# Patient Record
Sex: Male | Born: 1955 | Race: White | Hispanic: No | Marital: Single | Smoking: Former smoker
Health system: Southern US, Community
[De-identification: ages and names within clinical notes are randomized; demographics above are authoritative.]

## PROBLEM LIST (undated history)

## (undated) VITALS — BP 108/63 | HR 109 | Temp 98.0°F

## (undated) DIAGNOSIS — C801 Malignant (primary) neoplasm, unspecified: Secondary | ICD-10-CM

## (undated) DIAGNOSIS — F32A Depression, unspecified: Secondary | ICD-10-CM

## (undated) DIAGNOSIS — C029 Malignant neoplasm of tongue, unspecified: Secondary | ICD-10-CM

## (undated) DIAGNOSIS — F419 Anxiety disorder, unspecified: Secondary | ICD-10-CM

## (undated) DIAGNOSIS — S069X9A Unspecified intracranial injury with loss of consciousness of unspecified duration, initial encounter: Secondary | ICD-10-CM

## (undated) DIAGNOSIS — C189 Malignant neoplasm of colon, unspecified: Secondary | ICD-10-CM

## (undated) DIAGNOSIS — K219 Gastro-esophageal reflux disease without esophagitis: Secondary | ICD-10-CM

## (undated) DIAGNOSIS — S069XAA Unspecified intracranial injury with loss of consciousness status unknown, initial encounter: Secondary | ICD-10-CM

## (undated) DIAGNOSIS — F329 Major depressive disorder, single episode, unspecified: Secondary | ICD-10-CM

## (undated) DIAGNOSIS — I10 Essential (primary) hypertension: Secondary | ICD-10-CM

## (undated) DIAGNOSIS — M1990 Unspecified osteoarthritis, unspecified site: Secondary | ICD-10-CM

## (undated) DIAGNOSIS — E785 Hyperlipidemia, unspecified: Secondary | ICD-10-CM

## (undated) HISTORY — DX: Major depressive disorder, single episode, unspecified: F32.9

## (undated) HISTORY — PX: SPLENECTOMY: SUR1306

## (undated) HISTORY — DX: Anxiety disorder, unspecified: F41.9

## (undated) HISTORY — DX: Unspecified intracranial injury with loss of consciousness of unspecified duration, initial encounter: S06.9X9A

## (undated) HISTORY — DX: Hyperlipidemia, unspecified: E78.5

## (undated) HISTORY — DX: Malignant neoplasm of tongue, unspecified: C02.9

## (undated) HISTORY — DX: Essential (primary) hypertension: I10

## (undated) HISTORY — DX: Malignant neoplasm of colon, unspecified: C18.9

## (undated) HISTORY — DX: Unspecified intracranial injury with loss of consciousness status unknown, initial encounter: S06.9XAA

## (undated) HISTORY — DX: Depression, unspecified: F32.A

## (undated) HISTORY — DX: Unspecified osteoarthritis, unspecified site: M19.90

## (undated) HISTORY — DX: Hypomagnesemia: E83.42

## (undated) HISTORY — PX: COLECTOMY: SHX59

## (undated) HISTORY — DX: Gastro-esophageal reflux disease without esophagitis: K21.9

## (undated) HISTORY — DX: Malignant (primary) neoplasm, unspecified: C80.1

---

## 2010-02-11 ENCOUNTER — Emergency Department (HOSPITAL_COMMUNITY): Admission: EM | Admit: 2010-02-11 | Discharge: 2010-02-11 | Payer: Self-pay | Admitting: Emergency Medicine

## 2010-08-30 LAB — URINALYSIS, ROUTINE W REFLEX MICROSCOPIC
Bilirubin Urine: NEGATIVE
Glucose, UA: NEGATIVE mg/dL
Hgb urine dipstick: NEGATIVE
Ketones, ur: NEGATIVE mg/dL
Nitrite: NEGATIVE
Protein, ur: NEGATIVE mg/dL
Specific Gravity, Urine: 1.017 (ref 1.005–1.030)
Urobilinogen, UA: 1 mg/dL (ref 0.0–1.0)
pH: 6.5 (ref 5.0–8.0)

## 2010-11-30 ENCOUNTER — Emergency Department (HOSPITAL_COMMUNITY): Payer: Self-pay

## 2010-11-30 ENCOUNTER — Emergency Department (HOSPITAL_COMMUNITY)
Admission: EM | Admit: 2010-11-30 | Discharge: 2010-11-30 | Disposition: A | Discharge status: Home / Self Care | Payer: No Typology Code available for payment source | Attending: Emergency Medicine | Admitting: Emergency Medicine

## 2010-11-30 ENCOUNTER — Emergency Department (HOSPITAL_COMMUNITY): Payer: No Typology Code available for payment source

## 2010-11-30 DIAGNOSIS — M546 Pain in thoracic spine: Secondary | ICD-10-CM | POA: Insufficient documentation

## 2010-11-30 DIAGNOSIS — I10 Essential (primary) hypertension: Secondary | ICD-10-CM | POA: Insufficient documentation

## 2010-11-30 DIAGNOSIS — S139XXA Sprain of joints and ligaments of unspecified parts of neck, initial encounter: Secondary | ICD-10-CM | POA: Insufficient documentation

## 2010-11-30 DIAGNOSIS — S4350XA Sprain of unspecified acromioclavicular joint, initial encounter: Secondary | ICD-10-CM | POA: Insufficient documentation

## 2010-11-30 DIAGNOSIS — G8929 Other chronic pain: Secondary | ICD-10-CM | POA: Insufficient documentation

## 2010-11-30 DIAGNOSIS — M25519 Pain in unspecified shoulder: Secondary | ICD-10-CM | POA: Insufficient documentation

## 2010-11-30 DIAGNOSIS — E041 Nontoxic single thyroid nodule: Secondary | ICD-10-CM | POA: Insufficient documentation

## 2010-11-30 DIAGNOSIS — M542 Cervicalgia: Secondary | ICD-10-CM | POA: Insufficient documentation

## 2013-12-07 LAB — HM COLONOSCOPY

## 2014-04-10 ENCOUNTER — Emergency Department: Payer: Self-pay | Admitting: Emergency Medicine

## 2014-09-18 ENCOUNTER — Ambulatory Visit
Admit: 2014-09-18 | Disposition: A | Discharge status: Home / Self Care | Payer: Self-pay | Attending: Oncology | Admitting: Oncology

## 2014-09-29 ENCOUNTER — Ambulatory Visit
Admit: 2014-09-29 | Disposition: A | Discharge status: Home / Self Care | Payer: Self-pay | Attending: Vascular Surgery | Admitting: Vascular Surgery

## 2014-10-02 DIAGNOSIS — R079 Chest pain, unspecified: Secondary | ICD-10-CM

## 2014-10-02 LAB — CBC CANCER CENTER
Basophil #: 0.1 x10 3/mm (ref 0.0–0.1)
Basophil %: 1 %
EOS ABS: 0.1 x10 3/mm (ref 0.0–0.7)
Eosinophil %: 1.2 %
HCT: 41.5 % (ref 40.0–52.0)
HGB: 13.9 g/dL (ref 13.0–18.0)
LYMPHS ABS: 2.8 x10 3/mm (ref 1.0–3.6)
LYMPHS PCT: 26.3 %
MCH: 31.4 pg (ref 26.0–34.0)
MCHC: 33.5 g/dL (ref 32.0–36.0)
MCV: 94 fL (ref 80–100)
MONO ABS: 1.3 x10 3/mm — AB (ref 0.2–1.0)
Monocyte %: 12.4 %
NEUTROS ABS: 6.3 x10 3/mm (ref 1.4–6.5)
Neutrophil %: 59.1 %
PLATELETS: 289 x10 3/mm (ref 150–440)
RBC: 4.43 10*6/uL (ref 4.40–5.90)
RDW: 14.3 % (ref 11.5–14.5)
WBC: 10.7 x10 3/mm — ABNORMAL HIGH (ref 3.8–10.6)

## 2014-10-02 LAB — COMPREHENSIVE METABOLIC PANEL
ALK PHOS: 70 U/L
ALT: 18 U/L
AST: 21 U/L
Albumin: 4.3 g/dL
Anion Gap: 7 (ref 7–16)
BILIRUBIN TOTAL: 0.6 mg/dL
BUN: 21 mg/dL — ABNORMAL HIGH
CALCIUM: 9.4 mg/dL
CO2: 27 mmol/L
Chloride: 102 mmol/L
Creatinine: 0.89 mg/dL
EGFR (African American): >60
Glucose: 108 mg/dL — ABNORMAL HIGH
POTASSIUM: 3.7 mmol/L
SODIUM: 136 mmol/L
Total Protein: 7.5 g/dL

## 2014-10-02 LAB — CREATININE, SERUM: CREATINE, SERUM: 0.89

## 2014-10-09 LAB — CBC CANCER CENTER
BASOS ABS: 0 x10 3/mm (ref 0.0–0.1)
Basophil %: 0.8 %
Eosinophil #: 0.1 x10 3/mm (ref 0.0–0.7)
Eosinophil %: 2.8 %
HCT: 46.4 % (ref 40.0–52.0)
HGB: 15.4 g/dL (ref 13.0–18.0)
LYMPHS PCT: 36.8 %
Lymphocyte #: 1.3 x10 3/mm (ref 1.0–3.6)
MCH: 31.2 pg (ref 26.0–34.0)
MCHC: 33.3 g/dL (ref 32.0–36.0)
MCV: 94 fL (ref 80–100)
MONOS PCT: 3.9 %
Monocyte #: 0.1 x10 3/mm — ABNORMAL LOW (ref 0.2–1.0)
NEUTROS PCT: 55.7 %
Neutrophil #: 2 x10 3/mm (ref 1.4–6.5)
PLATELETS: 194 x10 3/mm (ref 150–440)
RBC: 4.95 10*6/uL (ref 4.40–5.90)
RDW: 13.9 % (ref 11.5–14.5)
WBC: 3.7 x10 3/mm — AB (ref 3.8–10.6)

## 2014-10-09 LAB — COMPREHENSIVE METABOLIC PANEL
ALK PHOS: 73 U/L
ANION GAP: 7 (ref 7–16)
Albumin: 4.4 g/dL
BILIRUBIN TOTAL: 0.8 mg/dL
BUN: 44 mg/dL — AB
CO2: 27 mmol/L
Calcium, Total: 9.3 mg/dL
Chloride: 101 mmol/L
Creatinine: 1.3 mg/dL — ABNORMAL HIGH
EGFR (African American): >60
EGFR (Non-African Amer.): >60
Glucose: 109 mg/dL — ABNORMAL HIGH
Potassium: 4.7 mmol/L
SGOT(AST): 22 U/L
SGPT (ALT): 22 U/L
Sodium: 135 mmol/L
TOTAL PROTEIN: 7.7 g/dL

## 2014-10-11 ENCOUNTER — Inpatient Hospital Stay
Admit: 2014-10-11 | Discharge: 2014-10-14 | Disposition: A | Discharge status: Home / Self Care | Payer: No Typology Code available for payment source | Source: Ambulatory Visit | Attending: Internal Medicine | Admitting: Internal Medicine

## 2014-10-11 LAB — DIFFERENTIAL
BASOS ABS: 0 10*3/uL (ref 0.0–0.1)
BASOS PCT: 0.5 %
EOS PCT: 1.2 %
Eosinophil #: 0 10*3/uL (ref 0.0–0.7)
Lymphocyte #: 0.9 10*3/uL — ABNORMAL LOW (ref 1.0–3.6)
Lymphocyte %: 62.1 %
MONOS PCT: 26.6 %
Monocyte #: 0.4 x10 3/mm (ref 0.2–1.0)
NEUTROS ABS: 0.1 10*3/uL — AB (ref 1.4–6.5)
Neutrophil %: 9.6 %

## 2014-10-11 LAB — CBC
HCT: 42.4 % (ref 40.0–52.0)
HGB: 13.8 g/dL (ref 13.0–18.0)
MCH: 30.9 pg (ref 26.0–34.0)
MCHC: 32.6 g/dL (ref 32.0–36.0)
MCV: 95 fL (ref 80–100)
Platelet: 166 10*3/uL (ref 150–440)
RBC: 4.47 10*6/uL (ref 4.40–5.90)
RDW: 13.9 % (ref 11.5–14.5)
WBC: 1.5 10*3/uL — AB (ref 3.8–10.6)

## 2014-10-11 LAB — COMPREHENSIVE METABOLIC PANEL
ALK PHOS: 56 U/L
Albumin: 3.9 g/dL
Anion Gap: 10 (ref 7–16)
BILIRUBIN TOTAL: 0.6 mg/dL
BUN: 55 mg/dL — ABNORMAL HIGH
CREATININE: 1.74 mg/dL — AB
Calcium, Total: 9.2 mg/dL
Chloride: 101 mmol/L
Co2: 26 mmol/L
EGFR (Non-African Amer.): 43 — ABNORMAL LOW
GFR CALC AF AMER: 49 — AB
Glucose: 117 mg/dL — ABNORMAL HIGH
POTASSIUM: 4.4 mmol/L
SGOT(AST): 29 U/L
SGPT (ALT): 23 U/L
Sodium: 137 mmol/L
Total Protein: 7 g/dL

## 2014-10-11 LAB — URINALYSIS, COMPLETE
Bilirubin,UR: NEGATIVE
Blood: NEGATIVE
Glucose,UR: NEGATIVE mg/dL (ref 0–75)
Ketone: NEGATIVE
Leukocyte Esterase: NEGATIVE
Nitrite: NEGATIVE
Ph: 5 (ref 4.5–8.0)
Protein: NEGATIVE
Specific Gravity: 1.024 (ref 1.003–1.030)

## 2014-10-12 LAB — BASIC METABOLIC PANEL
Anion Gap: 7 (ref 7–16)
BUN: 34 mg/dL — ABNORMAL HIGH
CALCIUM: 7.5 mg/dL — AB
CHLORIDE: 107 mmol/L
CO2: 22 mmol/L
Creatinine: 1.22 mg/dL
EGFR (African American): >60
EGFR (Non-African Amer.): >60
Glucose: 104 mg/dL — ABNORMAL HIGH
POTASSIUM: 3.8 mmol/L
Sodium: 136 mmol/L

## 2014-10-12 LAB — CBC WITH DIFFERENTIAL/PLATELET
Bands: 1 %
Basophil: 1 %
Comment - H1-Com2: NORMAL
Eosinophil: 2 %
HCT: 36 % — ABNORMAL LOW (ref 40.0–52.0)
HGB: 12 g/dL — ABNORMAL LOW (ref 13.0–18.0)
Lymphocytes: 57 %
MCH: 31.2 pg (ref 26.0–34.0)
MCHC: 33.3 g/dL (ref 32.0–36.0)
MCV: 94 fL (ref 80–100)
Monocytes: 38 %
PLATELETS: 131 10*3/uL — AB (ref 150–440)
RBC: 3.85 10*6/uL — ABNORMAL LOW (ref 4.40–5.90)
RDW: 13.7 % (ref 11.5–14.5)
Segmented Neutrophils: 1 %
WBC: 1.6 10*3/uL — AB (ref 3.8–10.6)

## 2014-10-13 LAB — CBC WITH DIFFERENTIAL/PLATELET
BASOS ABS: 1 %
Bands: 12 %
Eosinophil: 1 %
HCT: 37.3 % — ABNORMAL LOW (ref 40.0–52.0)
HGB: 12.5 g/dL — ABNORMAL LOW (ref 13.0–18.0)
LYMPHS PCT: 47 %
MCH: 31.3 pg (ref 26.0–34.0)
MCHC: 33.5 g/dL (ref 32.0–36.0)
MCV: 94 fL (ref 80–100)
Metamyelocyte: 1 %
Monocytes: 25 %
NRBC/100 WBC: 1 /
Platelet: 159 10*3/uL (ref 150–440)
RBC: 3.99 10*6/uL — ABNORMAL LOW (ref 4.40–5.90)
RDW: 13.6 % (ref 11.5–14.5)
Segmented Neutrophils: 13 %
WBC: 4 10*3/uL (ref 3.8–10.6)

## 2014-10-13 LAB — BASIC METABOLIC PANEL
ANION GAP: 6 — AB (ref 7–16)
BUN: 27 mg/dL — ABNORMAL HIGH
CALCIUM: 7.6 mg/dL — AB
CHLORIDE: 105 mmol/L
Co2: 23 mmol/L
Creatinine: 1.03 mg/dL
EGFR (African American): >60
GLUCOSE: 111 mg/dL — AB
Potassium: 3.2 mmol/L — ABNORMAL LOW
SODIUM: 134 mmol/L — AB

## 2014-10-13 LAB — CLOSTRIDIUM DIFFICILE(ARMC)

## 2014-10-13 LAB — CULTURE, BLOOD (SINGLE)

## 2014-10-13 LAB — URINE CULTURE

## 2014-10-14 LAB — CBC WITH DIFFERENTIAL/PLATELET
Bands: 13 %
HCT: 36.7 % — AB (ref 40.0–52.0)
HGB: 12.2 g/dL — ABNORMAL LOW (ref 13.0–18.0)
LYMPHS PCT: 24 %
MCH: 31.2 pg (ref 26.0–34.0)
MCHC: 33.2 g/dL (ref 32.0–36.0)
MCV: 94 fL (ref 80–100)
Monocytes: 21 %
NRBC/100 WBC: 1 /
PLATELETS: 206 10*3/uL (ref 150–440)
RBC: 3.9 10*6/uL — ABNORMAL LOW (ref 4.40–5.90)
RDW: 14 % (ref 11.5–14.5)
Segmented Neutrophils: 42 %
WBC: 10.7 10*3/uL — AB (ref 3.8–10.6)

## 2014-10-14 LAB — VANCOMYCIN, TROUGH: VANCOMYCIN, TROUGH: 10 ug/mL

## 2014-10-14 MED ORDER — PANTOPRAZOLE SODIUM 40 MG PO TBEC: 40.0000 mg | DELAYED_RELEASE_TABLET | Freq: Every day | ORAL | Status: DC

## 2014-10-14 MED ORDER — LISINOPRIL 20 MG PO TABS: 20.0000 mg | ORAL_TABLET | Freq: Every day | ORAL | Status: DC

## 2014-10-14 MED ORDER — SIMVASTATIN 20 MG PO TABS: 20.0000 mg | ORAL_TABLET | Freq: Every day | ORAL | Status: DC

## 2014-10-14 MED ORDER — VITAMIN C 500 MG PO TABS: 500.0000 mg | ORAL_TABLET | Freq: Two times a day (BID) | ORAL | Status: DC

## 2014-10-14 MED ORDER — LEVOFLOXACIN 750 MG PO TABS: 750.0000 mg | ORAL_TABLET | ORAL | Status: DC

## 2014-10-14 MED ORDER — VANCOMYCIN HCL 10 G IV SOLR: 1250.0000 mg | Freq: Two times a day (BID) | INTRAVENOUS | Status: DC

## 2014-10-14 MED ORDER — HYDROCODONE-ACETAMINOPHEN 5-325 MG PO TABS: 1.0000 | ORAL_TABLET | ORAL | Status: DC | PRN

## 2014-10-14 MED ORDER — DEXTROSE 5 % IV SOLN: 2.0000 g | Freq: Three times a day (TID) | INTRAVENOUS | Status: DC

## 2014-10-14 MED ORDER — ONDANSETRON HCL 4 MG/2ML IJ SOLN: 4.0000 mg | INTRAMUSCULAR | Status: DC | PRN

## 2014-10-14 MED ORDER — MIRTAZAPINE 15 MG PO TABS: 15.0000 mg | ORAL_TABLET | Freq: Every day | ORAL | Status: DC

## 2014-10-14 MED ORDER — ACETAMINOPHEN 325 MG PO TABS: 650.0000 mg | ORAL_TABLET | ORAL | Status: DC | PRN

## 2014-10-14 MED ORDER — OXYCODONE HCL 5 MG PO TABS: 10.0000 mg | ORAL_TABLET | Freq: Three times a day (TID) | ORAL | Status: DC | PRN

## 2014-10-14 MED ORDER — SODIUM CHLORIDE 0.9 % IJ SOLN: 3.0000 mL | INTRAMUSCULAR | Status: DC | PRN

## 2014-10-14 MED ORDER — CALCIUM CARBONATE ANTACID 500 MG PO CHEW: 400.0000 mg | CHEWABLE_TABLET | Freq: Four times a day (QID) | ORAL | Status: DC | PRN

## 2014-10-14 MED ORDER — MAGIC MOUTHWASH
10.0000 mL | Freq: Three times a day (TID) | ORAL | Status: DC
  Filled 2014-10-14 (×4): qty 10

## 2014-10-14 MED ORDER — NYSTATIN 100000 UNIT/ML MT SUSP: 5.0000 mL | Freq: Four times a day (QID) | OROMUCOSAL | Status: DC

## 2014-10-14 MED ORDER — FILGRASTIM 300 MCG/ML IJ SOLN: 300.0000 ug | INTRAMUSCULAR | Status: DC

## 2014-10-14 MED ORDER — SODIUM CHLORIDE 0.9 % IV SOLN: INTRAVENOUS | Status: DC

## 2014-10-15 NOTE — Op Note (Signed)
PATIENT NAME:  Mathew Robinson, Mathew Robinson MR#:  [id number] DATE OF BIRTH:  1956/01/07  DATE OF PROCEDURE:  09/29/2014  PREOPERATIVE DIAGNOSES:  Cancer of the tongue.  POSTOPERATIVE DIAGNOSIS:  Cancer of the tongue.  PROCEDURE PERFORMED: Insertion of a right internal jugular Infusaport with ultrasound and fluoroscopic guidance.   SURGEON: Katha Cabal, M.D.   SEDATION:  Versed plus fentanyl.   ACCESS: Right internal jugular.  FLUOROSCOPY TIME: Less than 0.1 minutes.   CONTRAST USED: None.   INDICATIONS: [name] is a 59 year old gentleman who has been found to have cancer of the tongue. He is, therefore, requiring chemotherapy and needs appropriate venous access. Risks and benefits for port insertion are reviewed. All questions answered. The patient agrees to proceed.   DESCRIPTION OF PROCEDURE: The patient is taken to special procedures and placed in the supine position. After adequate sedation is achieved, his neck and chest wall are prepped and draped in a sterile fashion. Appropriate timeout is called.   One percent lidocaine is infiltrated in the soft tissues at the base of the neck as well as on the chest wall. Ultrasound is placed in a sterile sleeve. Jugular vein is identified. It is echolucent and compressible indicating patency. Image is recorded for the permanent record and under real-time visualization, a microneedle is inserted into the jugular vein. Microwire is advanced and verified with fluoroscopic guidance. MicroSheath is then inserted and J-wire is advanced and positioned so that the tip is in the inferior vena cava.   A small counterincision is made at the wire insertion site and a linear incision is made 2 fingerbreadths below the clavicle. Pocket is fashioned with both blunt and sharp dissection.  It is then verified for appropriate size with the hub. The catheter is then passed subcutaneously from the pocket to the neck counterincision. Dilator and peel-away sheath are  inserted over the wire and the wire and dilator are removed. The catheter is inserted into the central venous system and the peel-away sheath is removed.   Under fluoroscopic guidance, the catheter is positioned with its tip at the cavoatrial junction.  It is then transected. The hub is connected and slipped into the pocket. The Port-A-Cath is then accessed percutaneously. It aspirates easily and flushes well. It is then verified under fluoroscopy to be smooth in contour with the tip in the proper position.   The pocket is then closed in layers using 3-0 Vicryl followed by 4-0 Monocryl subcuticular and Dermabond. Neck counterincision is closed with 4-0 Monocryl subcuticular. The patient tolerated the procedure well and there were no immediate complications. He was taken to recovery in excellent condition.   ____________________________ Katha Cabal, MD ggs:sp D: 10/02/2014 11:56:19 ET T: 10/02/2014 16:35:59 ET JOB#: [id number]  cc: Katha Cabal, MD, <Dictator> Kathlene November. Grayland Ormond, MD Katha Cabal MD ELECTRONICALLY SIGNED 10/03/2014 12:55

## 2014-10-15 NOTE — Consult Note (Addendum)
PATIENT NAME:  Mathew Robinson, Mathew Robinson MR#:  [id number] DATE OF BIRTH:  Dec 31, 1955  DATE OF CONSULTATION:  10/12/2014  REFERRING PHYSICIAN:  Dr. Manuella Ghazi CONSULTING PHYSICIAN:  Cheral Marker. Ola Spurr, MD  REASON FOR CONSULTATION:  Neutropenic fever and head and neck cancer.   HISTORY OF PRESENT ILLNESS:  This is a pleasant 59 year old gentleman with recent diagnosis of tongue cancer who has started chemotherapy.  He had a port placed 09/29/2014.  He was admitted 10/11/2014 with 1 to 2 days of high fevers to 103 as well as some chills.  He has had a little bit of nausea and vomiting as well.  He has not had any cough, shortness of breath. He has some mild sore throat.  He has mild diffuse abdominal pain.  He has not had any joint swelling or redness.  He did have a port placed and he feels it is not tender or giving him any problems at this point.  He has been started on vancomycin and cefepime.  Cultures so far are negative.   PAST MEDICAL HISTORY:   1.  Head and neck cancer as above starting chemotherapy.  2.  Hyperlipidemia.   3.  Hypertension.   4.  Arthritis.   PAST SURGICAL HISTORY:  Colectomy and splenectomy.    SOCIAL HISTORY:  50-pack-years, quit 20 years ago.  Prior heavy alcohol use.  He lives alone. Siblings are involved in his care.   FAMILY HISTORY:  Positive for skin cancer.  Father had head and neck cancer.    ANTIBIOTICS SINCE ADMISSION:  Vancomycin and Zosyn.   REVIEW OF SYSTEMS:  11 systems were reviewed and negative except as per HPI.     ALLERGIES:  PENICILLIN AND TRAMADOL.  PENICILLIN CAUSES HIVES BUT NOT ANAPHYLAXIS.   PHYSICAL EXAMINATION:  VITAL SIGNS:  Temperature 98.6, T-max 103.2, pulse 96, respirations 20, blood pressure 104/64, pulse oximetry 96% on room air.  GENERAL:  He is pleasant, interactive, in no acute distress.  His face is quite red and sunburned but he says he has been out at the sun.  HEENT:  Pupils are reactive.  Sclerae are anicteric.  His oropharynx has some  mild thrush.  He is edentulous.  NECK:  Supple.  No anterior cervical, posterior cervical or supraclavicular lymphadenopathy.  HEART:  Tachy but regular.  LUNGS:  Clear bilaterally.  ABDOMEN:  Soft, nondistended, mild nonfocal tenderness to palpation.  No right lower quadrant pain, however.  EXTREMITIES:  No clubbing, cyanosis or edema.  NEUROLOGIC:  He is alert and oriented, somewhat poor historian.  Nonfocal neuro exam.  SKIN:  No rashes or lesions.  VASCULAR ACCESS:  He has a Port-A-Cath in his right chest.  Since it is relatively placed, the incision is well healing, but there is a scab over it.  There is no tenderness.  He is diffusely red over the area but more in a sunburn pattern.   LABORATORY DATA:  White count on 10/11/2014 was 1.5 with a neutrophil count of 0.1. Followup white count 10/12/2014, 1.6, hemoglobin 12.0, platelets 133,000.  His white blood count on 10/09/2014 was 5.7 with a neutrophil count of 2.0.  Blood cultures x 2 are negative. Urinalysis negative.  Urine culture negative.  Renal function shows a creatinine of 1.22, down from 1.74 on 10/11/2014.  LFTs are normal. Chest x-ray shows no acute cardiopulmonary disease.   IMPRESSION:  A 59 year old gentleman admitted with neutropenic fever.  He also had splenectomy.  He has mild thrush noted on exam.  Other than that, his examination is nonfocal. Blood cultures are negative. Chest x-ray is negative.   RECOMMENDATIONS:  1.  Continue cefepime and vancomycin.  2.  Start nystatin for the thrush.  3.  Follow neutrophil count.  4.  I agree with oncology consult.  5.  He is also on levofloxacin, which would be okay to continue for now, especially given his history of the splenectomy.  6.  Advised the patient he will likely need to remain in the hospital for 2 to 3 days until he has clinical improvement and his neutropenia has resolved.  If all cultures are negative at that time, he can be discharged likely on a short course of  levofloxacin or Augmentin.   Thank you for the consult.  I will be glad to follow with you.    ____________________________ Cheral Marker. Ola Spurr, MD dpf:NT D: 10/12/2014 14:31:00 ET T: 10/12/2014 15:07:45 ET JOB#: [id number]  cc: Cheral Marker. Ola Spurr, MD, <Dictator> Greggory Safranek Ola Spurr MD ELECTRONICALLY SIGNED 10/17/2014 10:23

## 2014-10-15 NOTE — Consult Note (Signed)
Reason for Visit: This 59 year old Male patient presents to the clinic for initial evaluation of  head and neck cancer .   Referred by Dr. Rojelio Brenner ear nose and throat.  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  Pathology Report pathology report reviewed   Imaging Report PET CT scan reviewed   Referral Report clinical notes reviewed   Planned Treatment Regimen induction chemotherapy followed by concurrent chemoradiation with curative intent   HPI   patient is a 59 year old male who over the past several months noticed enlarging left sub-gastric high cervical lymph node growing in size. CT scan of the neck showed a 2 x 2 centimeter level XXIII nodal area slightly hyperdense in appearance without central necrosis. Patient underwent lymph node biopsy by ENT positive for 2 lymph nodes showing metastatic squamous cell carcinoma. Patient had a PET CT scan demonstrated hypermetabolic activity localizes to the left base of tongue vallecula region. There is still residual mild metabolic activity within the left level II location of prior surgical excision. No other distant metastatic disease was noted. Patient does not complain of head and neck pain or dysphagia. Weight has been stable. Patient has multiplecomorbidities is medically disabled rides a motor scooter.  Past Hx:    HTN:    Colon Cancer:    Dyslipidemia:    Arthritis:    Depression:    Colectomy:    Splenectomy:   Past, Family and Social History:  Past Medical History positive   Cardiovascular hyperlipidemia; hypertension   Cancer history colon cancer   Neurological/Psychiatric depression   Past Surgical History splenectomy   Past Medical History Comments arthritis   Family History positive   Family History Comments sister with skin cancer mother with headaches father expired from head and neck cancer    Social History positive   Social History Comments 50-pack-year smoking history quit smoking 20 years prior heavy EtOH abuse in the past   Additional Past Medical and Surgical History accompanied by brother and sister today   Allergies:   Penicillin: Swelling, Itching  Tramadol: Unknown  Home Meds:  Home Medications: Medication Instructions Status  Biaxin 500 mg oral tablet 1 tab(s) orally every 12 hours Active  ibuprofen 800 mg oral tablet 1 tab(s) orally 3 times a day, As Needed Active  gabapentin 1 cap(s) orally every 8 hours Active  fenofibrate  orally once a day Active  simvastatin 10 mg oral tablet 1 tab(s) orally once a day (at bedtime) Active  lisinopril 1 tab(s) orally once a day Active  hydrochlorothiazide 12.5 mg oral capsule 1 cap(s) orally once a day Active  Fish Oil 1000 mg oral capsule 1 cap(s) orally once a day (at bedtime) Active  Vitamin C 500 mg oral tablet 1 tab(s) orally once a day Active  vitamin E 1 cap(s) orally once a day Active   Review of Systems:  General negative   Performance Status (ECOG) 0   Skin negative   Breast negative   Ophthalmologic negative   ENMT see HPI   Respiratory and Thorax negative   Cardiovascular negative   Gastrointestinal negative   Genitourinary negative   Musculoskeletal negative   Neurological negative   Psychiatric negative   Hematology/Lymphatics negative   Endocrine negative   Allergic/Immunologic negative   Review of Systems   denies any weight loss, fatigue, weakness, fever, chills or night sweats. Patient denies any loss  of vision, blurred vision. Patient denies any ringing  of the ears or hearing loss. No irregular heartbeat. Patient denies heart murmur or history of fainting. Patient denies any chest pain or pain radiating to her upper extremities. Patient denies any shortness of breath, difficulty breathing at night, cough or hemoptysis. Patient denies any swelling in the lower legs. Patient  denies any nausea vomiting, vomiting of blood, or coffee ground material in the vomitus. Patient denies any stomach pain. Patient states has had normal bowel movements no significant constipation or diarrhea. Patient denies any dysuria, hematuria or significant nocturia. Patient denies any problems walking, swelling in the joints or loss of balance. Patient denies any skin changes, loss of hair or loss of weight. Patient denies any excessive worrying or anxiety or significant depression. Patient denies any problems with insomnia. Patient denies excessive thirst, polyuria, polydipsia. Patient denies any swollen glands, patient denies easy bruising or easy bleeding. Patient denies any recent infections, allergies or URI. Patient "s visual fields have not changed significantly in recent time.   Nursing Notes:  Nursing Vital Signs and Chemo Nursing Nursing Notes: *CC Vital Signs Flowsheet:   04-Apr-16 14:22  Temp Temperature 98.3  Pulse Pulse 110  Respirations Respirations 20  SBP SBP 169  DBP DBP 110  Pain Scale (0-10)  6  Current Weight (kg) (kg) 78.2  Height (cm) centimeters 175.2  BSA (m2) 1.9   Physical Exam:  General/Skin/HEENT:  General normal   Skin normal   Eyes normal   Additional PE well-developed male in NAD. Oral cavity patient is edentulous wears both upper and lower dentures. No oral mucosal lesions are identified. Indirect mirror examination shows lesion in the left base of tongue vallecula region slightly ulcerative. He has firm adenopathy present in the left sub-digastric high cervical nodal group in area of prior open biopsy. Lower cervical chain right neck and bilateral supraclavicular processes are clear. Lungs are clear to A&P cardiac examination shows regular rate and rhythm.   Breasts/Resp/CV/GI/GU:  Respiratory and Thorax normal   Cardiovascular normal   Gastrointestinal normal   Genitourinary normal   MS/Neuro/Psych/Lymph:  Musculoskeletal normal    Neurological normal   Lymphatics normal   Relevent Results:   Relevant Scans and Labs PET CT scan is reviewed   Assessment and Plan: Impression:   stage IV a squamous cell carcinoma left base of tongue in 59 year old male for induction chemotherapy followed by concurrent chemoradiation with curative intent Plan:   I discussed. The case personally with medical oncology. Based on the patient's excellent general condition young age and probable residual bulky adenopathy in his left neck would recommend induction chemotherapy followed by concurrent chemoradiation. We'll plan on delivering 7000 cGy using IM RT treatment planning and delivery to his head and neck encompassing area of both nodal involvement as well as left base of tongue. Which rate remaining lymph nodes in his neck to 5400 cGy using IM RT dose painting technique. Risks and benefits of treatment including xerostomia, skin reaction, oral mucositis, alteration of blood counts, change in taste all were explained in detail to the patient and his family. I will reevaluate the patient after completion of induction chemotherapy and coordinate concurrent chemotherapy with medical oncology. Patient will see medical oncology today.  I would like to take this opportunity for allowing me to participate in the care of your patient..  Electronic Signatures: Salimatou Simone, Roda Shutters (MD)  (Signed 04-Apr-16 15:51)  Authored: HPI, Diagnosis, Past Hx, PFSH, Allergies, Home Meds, ROS, Nursing Notes,  Physical Exam, Relevent Results, Encounter Assessment and Plan   Last Updated: 04-Apr-16 15:51 by Armstead Peaks (MD)

## 2014-10-15 NOTE — H&P (Addendum)
PATIENT NAME:  Mathew Robinson, Mathew Robinson MR#:  [id number] DATE OF BIRTH:  1955-07-14  DATE OF ADMISSION:  10/11/2014  PRIMARY DOCTOR:  Dr. .  EMERGENCY ROOM PHYSICIAN:  Dr. Delora Fuel  ONCOLOGIST:  Kathlene November. Grayland Ormond, M.D.   CHIEF COMPLAINT:  Fever.  HISTORY OF PRESENT ILLNESS: A 59 year old male patient with history of squamous cell carcinoma of head and neck brought in because of the fever. The patient noted to have fever for the past 3 days associated with chills.  No throat pain. No nausea. No vomiting. No diarrhea.  No hematuria or dysuria.  The patient had a port placement on April 15.  Last chemotherapy was 5 days ago. The patient noted to have temperature of 100.3 Fahrenheit in the Emergency Room, but at home he says his fever was very high. The patient's Port-A-Cath is slightly red, erythematous, and tender to touch.  PAST MEDICAL HISTORY: Significant for history of recently diagnosed stage IV squamous cell carcinoma of the base of the tongue.     PAST MEDICAL HISTORY INCLUDES:  Hyperlipidemia, hypertension, arthritis.   PAST SURGICAL HISTORY SIGNIFICANT FOR:  Colectomy, splenectomy.   ALLERGIES:  PENICILLIN AND TRAMADOL.  PENICILLIN CAUSES HIVES,  BUT NOT ANAPHYLAXIS.   FAMILY HISTORY: Sister had skin cancer. Mother had headaches.  Father expired from head and neck cancer.   SOCIAL HISTORY: Smoked 50-pack years, quit over 20 years ago.  History of heavy alcohol abuse in the past.  He lives alone.    MEDICATIONS AT HOME:  Ibuprofen 800 mg t.i.d. as needed.  Neurontin, dose unknown, he takes 3 capsules a day.  HCTZ 12.5 mg p.o. daily. The patient is on lisinopril, the dose is not known. The patient goes to [facility].  We will confirm that.  Oxycodone 10 mg p.o. every 6 hours, Prochlorperazine 10 mg q. 6 hours, Simvastatin 10 mg p.o. daily, vitamin C 500 mg p.o. daily.   REVIEW OF SYSTEMS:   GENERAL:  He has fever about 3 days and feels weak and also fatigue present.  EYES: No blurred  vision.  EARS, NOSE, AND THROAT: No tinnitus, no epistaxis. Has difficulty swallowing.  RESPIRATORY: No cough, no wheezing.  CARDIOVASCULAR: No chest pain, no orthopnea.  GASTROINTESTINAL: No nausea, no vomiting, no abdominal pain.  GENITOURINARY: No dysuria.  ENDOCRINE: No polyuria or nocturia.  MUSCULOSKELETAL: The patient has no joint pain.  NEUROLOGIC: No history of strokes. PSYCHIATRIC:  No anxiety or insomnia.   PHYSICAL EXAMINATION:  VITAL SIGNS: Temperature 100.3 Fahrenheit, heart rate 131, blood pressure 100/66, saturations 95% on room air. GENERAL:  He is a well-developed, well-nourished male not in distress.  HEAD: Atraumatic, normocephalic.  EYES: Pupils equal, reacting to light.  No conjunctival pallor. No scleral icterus.  NOSE: No nasal lesions. No drainage.  EARS: No drainage of fluid.  MOUTH: No lesions. No exudates.  NECK: Supple. No JVD. No carotid bruit.  Port-A-Cath present in the right anterior chest.  Redness and slight tenderness present. Stitches are intact.  I see some dried area of pus present.  CARDIOVASCULAR: S1, S2 regular.  Tachycardic.  GASTROINTESTINAL: Abdomen is soft, nontender, nondistended. Bowel sounds present. The patient has no CVA tenderness.  MUSCULOSKELETAL: Extremities move x 4. Strength and tone are equal bilaterally. NEUROLOGIC:  Cranial nerves II through XII intact.  Power 5/5 upper and lower extremities.DT 2+bilaterally. sensations are intact. bilaterally. [name] PSYCHIATRIC: Mood and affect are within normal limits.   LABORATORY DATA: Urinalysis is clear. No leukocyte esterase.  Chem 7:  Sodium 137, potassium 4.4, chloride 101, bicarbonate 26, BUN 55, creatinine ( , glucose 117.   CBC: WBC 1.4, hemoglobin 13.8, hematocrit 42.4, platelets 166,000.  Electrolytes as I mentioned. The patient's creatinine 1.3 on April 25.  Chest x-ray today shows Port-A-Cath is present in place and no acute disease.  ASSESSMENT AND PLAN:  1.  This is a 59 year old  with neutropenic fever.  Start him on vancomycin time and cefepime and follow blood cultures.  2.  Possible about Port-A-Cath infection.  Obtain vascular consult, infectious disease consult and evaluate and see if Porta-A-Cath needs to come out. 3.  Acute renal failure due to fever and dehydration. Continue fluids, monitor kidney function.  4.  History of stage IV cancer of tongue.  Follows up with oncology and radiation oncology.  Consult oncology for monitoring disease  activity. 5.  HLP;.  Continue Simvastatin 10 mg daily.  6.  Depression. Continue mirtazapine 15 mg bedtime. 7.  Neuropathy.  He is on Neurontin, but I do not know the dose.  Will get the list updated and restart it.  8.  Because of acute renal failure, hold his  lisinopril.,continue fluids  TIME SPENT: About 55 minutes.   ____________________________ Epifanio Lesches, MD sk:sp D: 10/11/2014 16:56:41 ET T: 10/11/2014 17:18:06 ET JOB#: [id number]  cc: Epifanio Lesches, MD, <Dictator> Epifanio Lesches MD ELECTRONICALLY SIGNED 10/17/2014 23:47

## 2014-10-15 NOTE — Consult Note (Signed)
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 HPI:   Patient is a 59 year old male who recently initiated chemotherapy for the above stated head and neck cancer. Patient had a poor appetite with nausea over the past several days. He also started having increasing fevers. He otherwise has felt well. He has no neurologic complaints. He denies any dysphasia. He has no chest pain or shortness of breath. He denies any nausea, vomiting, constipation, or diarrhea. He has no urinary complaints. Patient offers no further specific complaints today.   Review of Systems:  General: fever  weakness  fatigue  Performance Status (ECOG): 2  HEENT: denies dysphagia or pain.  Lungs: no complaints  Cardiac: no complaints  GI: nausea  Pain ?: No complaints (0, none)  Emotional well-being: None  Review of Systems:   As per HPI. Otherwise, a complete review of systems is negative.   Allergies:  Penicillin: Swelling, Itching  Tramadol: Unknown  PFSH: Additional Past Medical and Surgical History: colon cancer status post partial colectomy, unclear stage, hypertension, hyperlipidemia, depression, splenectomy.    Family history: Hypercholesterolemia, father with head and neck cancer.    Social history: Former heavy tobacco use, quit greater than 15 years ago. Heavy alcohol use.   Home Medications: Medication Instructions Last Modified Date/Time  prochlorperazine 10 mg oral tablet 1 tab(s) orally every 6 hours 27-Apr-16 17:00  [facility] Mouthwash HC-Nystatin-Diphenhydramine Mouthwash 5 milliliter(s) orally 4 times a day, As Needed 27-Apr-16 17:00  oxyCODONE 10 mg oral tablet 1 tab(s) orally every 8 hours, As Needed - for Pain 27-Apr-16 17:00  mirtazapine 15 mg oral tablet 1 tab(s) orally once a day (at bedtime) 27-Apr-16 17:00  lisinopril 40 mg oral tablet 1 tab(s) orally once a day 27-Apr-16 17:00  simvastatin 20  mg oral tablet 1 tab(s) orally once a day (at bedtime) 27-Apr-16 17:00  amLODIPine 10 mg oral tablet 1 tab(s) orally once a day 27-Apr-16 17:00  cyclobenzaprine 10 mg oral tablet 1 tab(s) orally 3 times a day, As Needed for muscle spasms.  27-Apr-16 17:00  gabapentin 600 mg oral tablet 1 tab(s) orally 3 times a day 27-Apr-16 17:00   Vital Signs:  :: Recent fever greater than 101. Vital signs otherwise stable.   Physical Exam:  General: well developed, well nourished, and in no acute distress  Mental Status: normal affect  Eyes: anicteric sclera  Head, Ears, Nose,Throat: Normocephalic, moist mucous membranes, clear oropharynx without erythema or thrush.  Neck, Thyroid: easily palpable lymphadenopathy of left neck.  Respiratory: clear to auscultation bilaterally  Cardiovascular: regular rate and rhythm, no murmur, rub, or gallop  Gastrointestinal: soft, nondistended, nontender, no organomegaly.  normal active bowel sounds  Musculoskeletal: No edema  Skin: No rash or petechiae noted  Neurological: alert, answering all questions appropriately.  Cranial nerves grossly intact   Laboratory Results: Routine Chem:  28-Apr-16 05:20   Result Comment WBC - CRITICAL VALUE PREVIOUSLY NOTIFIED.  - RESULTS VERIFIED BY REPEAT TESTING. PLATELETS - SLIGHT PLATELET CLUMPING IN SPECIMEN. ACTUAL  - NUMERICAL COUNT MAY BE SOMEWHAT HIGHER THAN  - THE REPORTED VALUE.  Result(s) reported on 12 Oct 2014 at06:17AM.  Glucose, Serum  104 (65-99 NOTE: New Reference Range  08/22/14)  BUN  34 (6-20 NOTE: New Reference Range  08/22/14)  Creatinine (comp) 1.22 (0.61-1.24 NOTE: New Reference Range  08/22/14)  Sodium, Serum 136 (135-145 NOTE: New Reference Range  08/22/14)  Potassium, Serum 3.8 (  3.5-5.1 NOTE: New Reference Range  08/22/14)  Chloride, Serum 107 (101-111 NOTE: New Reference Range  08/22/14)  CO2, Serum 22 (22-32 NOTE: New Reference Range  08/22/14)  Calcium (Total), Serum  7.5  (8.9-10.3 NOTE: New Reference Range  08/22/14)  Anion Gap 7  eGFR (African American) >60  eGFR (Non-African American) >60 (eGFR values <17m/min/1.73 m2 may be an indication of chronic kidney disease (CKD). Calculated eGFR is useful in patients with stable renal function. The eGFR calculation will not be reliable in acutely ill patients when serum creatinine is changing rapidly. It is not useful in patients on dialysis. The eGFR calculation may not be applicable to patients at the low and high extremes of body sizes, pregnant women, and vegetarians.)  Routine Hem:  28-Apr-16 05:20   WBC (CBC)  1.6  RBC (CBC)  3.85  Hemoglobin (CBC)  12.0  Hematocrit (CBC)  36.0  Platelet Count (CBC)  131  MCV 94  MCH 31.2  MCHC 33.3  RDW 13.7  Bands 1  Segmented Neutrophils 1  Lymphocytes 57  Monocytes 38  Eosinophil 2  Basophil 1  Diff Comment 1 PLTS VARIED IN SIZE  Diff Comment 2 RBCs APPEAR NORMAL  Result(s) reported on 12 Oct 2014 at 06:17AM.   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 Plan:   1. Neutropenic fever: Blood and urine cultures are negative to date. Appreciate infectious disease input. Agree with current antibiotics. Patient is neutropenic from his chemotherapy. He did not receive Neulasta, therefore will give Neupogen daily until his A[place]le[place]ixis greater than 1000. Patient can be discharged from the hospital once he is afebrile for greater than 24 hours.Head and neck cancer: Patient received Taxotere, cisplatin, and 5-FU pump approximately one week ago. He has been instructed to keep his previously scheduled follow-up appointment for consideration of cycle 2 in approximately 2 weeks. will be out of town until Monday.  Please contact the on-call physician, Dr. GInez Pilgrim if there are any questions.   Diagnosis:  Date of Diagnosis 15-Aug-2014   Initial Pathology Report Date 15-Aug-2014   Final Pathology Report  Date 15-Aug-2014   Electronic Signatures: FDelight Hoh(MD)  (Signed 28-Apr-16 18:36)  Authored: Note Type, CC/HPI, Review of Systems, ALLERGIES, Patient Family Social History, HOME MEDICATIONS, Vital Signs, Physical Exam, Lab Results Review, Assessment and Plan, Quality Measures   Last Updated: 28-Apr-16 18:36 by FDelight Hoh(MD)

## 2014-10-16 NOTE — Discharge Summary (Addendum)
PATIENT NAME:  Mathew Robinson, Mathew Robinson MR#:  [id number] DATE OF BIRTH:  Apr 29, 1956  DATE OF ADMISSION:  10/11/2014 DATE OF DISCHARGE:  10/14/2014  ADMISSION DIAGNOSES:  1.  Neutropenic fever.  2.  Acute renal failure.   DISCHARGE DIAGNOSES: 1.  Neutropenic fever.  2.  Acute renal failure.   CONSULTATIONS:  1.  Dr. Ola Spurr.  2.  Dr. Grayland Ormond.   PHYSICAL EXAMINATION AT DISCHARGE: VITAL SIGNS: Temperature 98.2, pulse 95, respirations 20, blood pressure 131/79, saturation 96% on room air.  GENERAL: The patient is alert and oriented, not in acute distress.   CARDIOVASCULAR: Regular rate and rhythm, no murmurs, gallops, or rubs.  ABDOMEN: Bowel sounds positive, nontender, nondistended.  LUNGS: Clear to auscultation without crackles, rales, rhonchi or wheezing.   LABORATORY DATA: Discharge white blood cells 10.7, hemoglobin 12, hematocrit 37, platelets at 206,000.   HOSPITAL COURSE: This is a very pleasant 59 year old male presented on April 27, with neutropenic fever, for further details please refer to the H and P.  1.  Neutropenic fever of unclear etiology. Blood culture is negative to date. Urinalysis and chest x-ray negative for overt infection. No skin lesions. There is a Port-A-Cath in the right chest wall, but this is nontender. No erythema. Initially patient was placed on broad-spectrum antibiotics including cefepime, Levaquin, and vancomycin. These have been discontinued and as per infectious disease the patient may be discharged with Levaquin as his cultures are negative and white blood cell count has improved.  2.  Neutropenia. Appreciate oncology consultation. He did receive Neupogen; white blood cells have improved.  3.  Acute renal failure. Resolved after intravenous fluids.  4.  Stage IV cancer of the tongue. Continue chemotherapy per oncology.  5.  History of hyperlipidemia. Continue simvastatin.  6.  Depression. The patient will continue on Remeron.   DISCHARGE MEDICATIONS:  1.   Prochlorperazine 10 mg q. 6 hours.  2.  Oxycodone 10 mg q. 8 hours p.r.n.  3.  Remeron 15 mg daily.  4.  Lisinopril 40 mg daily.  5.  Simvastatin 20 mg at bedtime.  6.  Cyclobenzaprine 10 mg t.i.d. p.r.n.  7.  Gabapentin 600 mg t.i.d.  8.  Nystatin 5 mL q. 6 hours.  9.  Levaquin 750 q. 24 hours x 6 days.   DISCHARGE DIET: Regular diet.   DISCHARGE ACTIVITY: As tolerated.   FOLLOWUP: The patient will follow up with Dr. Grayland Ormond in 1 week.    TIME SPENT: 35 minutes.   The patient was stable for discharge.    ____________________________ Martino Tompson P. Benjie Karvonen, MD spm:nt D: 10/14/2014 12:40:15 ET T: 10/15/2014 07:35:24 ET JOB#: [id number]  cc: Kenzie Flakes P. Benjie Karvonen, MD, <Dictator> Kathlene November. Grayland Ormond, MD Donell Beers Miliyah Luper MD ELECTRONICALLY SIGNED 10/20/2014 13:31

## 2014-10-18 ENCOUNTER — Other Ambulatory Visit: Payer: Self-pay | Admitting: Oncology

## 2014-10-18 DIAGNOSIS — C01 Malignant neoplasm of base of tongue: Secondary | ICD-10-CM

## 2014-10-19 MED ORDER — LIDOCAINE-PRILOCAINE 2.5-2.5 % EX CREA: TOPICAL_CREAM | CUTANEOUS | Status: DC

## 2014-10-23 ENCOUNTER — Inpatient Hospital Stay (HOSPITAL_BASED_OUTPATIENT_CLINIC_OR_DEPARTMENT_OTHER): Payer: Medicare Other | Admitting: Oncology

## 2014-10-23 ENCOUNTER — Inpatient Hospital Stay: Payer: Medicare Other

## 2014-10-23 ENCOUNTER — Inpatient Hospital Stay: Payer: Medicare Other | Attending: Oncology

## 2014-10-23 VITALS — BP 112/71 | HR 89 | Temp 98.3°F | Resp 20 | Wt 167.8 lb

## 2014-10-23 DIAGNOSIS — M1990 Unspecified osteoarthritis, unspecified site: Secondary | ICD-10-CM | POA: Diagnosis not present

## 2014-10-23 DIAGNOSIS — I10 Essential (primary) hypertension: Secondary | ICD-10-CM | POA: Diagnosis not present

## 2014-10-23 DIAGNOSIS — C01 Malignant neoplasm of base of tongue: Secondary | ICD-10-CM | POA: Diagnosis not present

## 2014-10-23 DIAGNOSIS — Z79899 Other long term (current) drug therapy: Secondary | ICD-10-CM | POA: Insufficient documentation

## 2014-10-23 DIAGNOSIS — R634 Abnormal weight loss: Secondary | ICD-10-CM | POA: Diagnosis not present

## 2014-10-23 DIAGNOSIS — E785 Hyperlipidemia, unspecified: Secondary | ICD-10-CM | POA: Insufficient documentation

## 2014-10-23 DIAGNOSIS — D6481 Anemia due to antineoplastic chemotherapy: Secondary | ICD-10-CM | POA: Diagnosis not present

## 2014-10-23 DIAGNOSIS — R5383 Other fatigue: Secondary | ICD-10-CM | POA: Diagnosis not present

## 2014-10-23 DIAGNOSIS — Z87891 Personal history of nicotine dependence: Secondary | ICD-10-CM | POA: Insufficient documentation

## 2014-10-23 DIAGNOSIS — R531 Weakness: Secondary | ICD-10-CM | POA: Diagnosis not present

## 2014-10-23 DIAGNOSIS — R630 Anorexia: Secondary | ICD-10-CM | POA: Diagnosis not present

## 2014-10-23 DIAGNOSIS — Z5111 Encounter for antineoplastic chemotherapy: Secondary | ICD-10-CM | POA: Diagnosis present

## 2014-10-23 DIAGNOSIS — R5381 Other malaise: Secondary | ICD-10-CM | POA: Insufficient documentation

## 2014-10-23 DIAGNOSIS — F17210 Nicotine dependence, cigarettes, uncomplicated: Secondary | ICD-10-CM

## 2014-10-23 DIAGNOSIS — Z7982 Long term (current) use of aspirin: Secondary | ICD-10-CM | POA: Insufficient documentation

## 2014-10-23 LAB — CBC WITH DIFFERENTIAL/PLATELET
BASOS PCT: 1 %
Basophils Absolute: 0.1 10*3/uL (ref 0–0.1)
Eosinophils Absolute: 0 10*3/uL (ref 0–0.7)
Eosinophils Relative: 0 %
HCT: 30.8 % — ABNORMAL LOW (ref 40.0–52.0)
Hemoglobin: 10.4 g/dL — ABNORMAL LOW (ref 13.0–18.0)
LYMPHS ABS: 2.1 10*3/uL (ref 1.0–3.6)
LYMPHS PCT: 17 %
MCH: 31.2 pg (ref 26.0–34.0)
MCHC: 33.7 g/dL (ref 32.0–36.0)
MCV: 92.5 fL (ref 80.0–100.0)
MONO ABS: 1.6 10*3/uL — AB (ref 0.2–1.0)
Monocytes Relative: 12 %
NEUTROS ABS: 9.1 10*3/uL — AB (ref 1.4–6.5)
NEUTROS PCT: 70 %
PLATELETS: 602 10*3/uL — AB (ref 150–440)
RBC: 3.33 MIL/uL — AB (ref 4.40–5.90)
RDW: 13.9 % (ref 11.5–14.5)
WBC: 12.9 10*3/uL — AB (ref 3.8–10.6)

## 2014-10-23 LAB — COMPREHENSIVE METABOLIC PANEL
ALT: 34 U/L (ref 17–63)
AST: 25 U/L (ref 15–41)
Albumin: 2.8 g/dL — ABNORMAL LOW (ref 3.5–5.0)
Alkaline Phosphatase: 98 U/L (ref 38–126)
Anion gap: 8 (ref 5–15)
BUN: 22 mg/dL — ABNORMAL HIGH (ref 6–20)
CALCIUM: 8.6 mg/dL — AB (ref 8.9–10.3)
CHLORIDE: 101 mmol/L (ref 101–111)
CO2: 28 mmol/L (ref 22–32)
CREATININE: 1.08 mg/dL (ref 0.61–1.24)
GFR calc non Af Amer: >60 mL/min (ref >60)
Glucose, Bld: 115 mg/dL — ABNORMAL HIGH (ref 65–99)
POTASSIUM: 4.5 mmol/L (ref 3.5–5.1)
SODIUM: 137 mmol/L (ref 135–145)
Total Bilirubin: 0.3 mg/dL (ref 0.3–1.2)
Total Protein: 6.7 g/dL (ref 6.5–8.1)

## 2014-10-23 MED ORDER — DIPHENHYDRAMINE HCL 50 MG/ML IJ SOLN
25.0000 mg | Freq: Once | INTRAMUSCULAR | Status: AC
  Administered 2014-10-23: 25 mg via INTRAVENOUS
  Filled 2014-10-23: qty 1

## 2014-10-23 MED ORDER — FAMOTIDINE IN NACL 20-0.9 MG/50ML-% IV SOLN
20.0000 mg | Freq: Once | INTRAVENOUS | Status: AC
  Administered 2014-10-23: 20 mg via INTRAVENOUS
  Filled 2014-10-23: qty 50

## 2014-10-23 MED ORDER — HEPARIN SOD (PORK) LOCK FLUSH 100 UNIT/ML IV SOLN
500.0000 [IU] | Freq: Once | INTRAVENOUS | Status: DC | PRN
  Filled 2014-10-23: qty 5

## 2014-10-23 MED ORDER — DOCETAXEL CHEMO INJECTION 160 MG/16ML
75.0000 mg/m2 | Freq: Once | INTRAVENOUS | Status: AC
  Administered 2014-10-23: 140 mg via INTRAVENOUS
  Filled 2014-10-23: qty 14

## 2014-10-23 MED ORDER — SODIUM CHLORIDE 0.9 % IV SOLN
Freq: Once | INTRAVENOUS | Status: AC
  Administered 2014-10-23: 11:00:00 via INTRAVENOUS
  Filled 2014-10-23: qty 250

## 2014-10-23 MED ORDER — PALONOSETRON HCL INJECTION 0.25 MG/5ML
0.2500 mg | Freq: Once | INTRAVENOUS | Status: AC
Start: 2014-10-23 — End: 2014-10-23
  Administered 2014-10-23: 0.25 mg via INTRAVENOUS
  Filled 2014-10-23: qty 5

## 2014-10-23 MED ORDER — FOSAPREPITANT DIMEGLUMINE INJECTION 150 MG
Freq: Once | INTRAVENOUS | Status: AC
  Administered 2014-10-23: 15:00:00 via INTRAVENOUS
  Filled 2014-10-23: qty 5

## 2014-10-23 MED ORDER — SODIUM CHLORIDE 0.9 % IJ SOLN
10.0000 mL | INTRAMUSCULAR | Status: DC | PRN
  Administered 2014-10-23: 10 mL
  Filled 2014-10-23: qty 10

## 2014-10-23 MED ORDER — SODIUM CHLORIDE 0.9 % IV SOLN
100.0000 mg/m2 | Freq: Once | INTRAVENOUS | Status: AC
  Administered 2014-10-23: 191 mg via INTRAVENOUS
  Filled 2014-10-23: qty 191

## 2014-10-23 MED ORDER — POTASSIUM CHLORIDE 2 MEQ/ML IV SOLN
Freq: Once | INTRAVENOUS | Status: AC
  Administered 2014-10-23: 12:00:00 via INTRAVENOUS
  Filled 2014-10-23: qty 1000

## 2014-10-23 MED ORDER — SODIUM CHLORIDE 0.9 % IV SOLN
1000.0000 mg/m2/d | INTRAVENOUS | Status: DC
  Administered 2014-10-23: 7650 mg via INTRAVENOUS
  Filled 2014-10-23: qty 153

## 2014-10-23 NOTE — Progress Notes (Signed)
Patient denies any concerns today.  

## 2014-10-27 NOTE — Progress Notes (Signed)
[place]  [phone] [phone]  ID: Bonnita Levan OB: Oct 15, 1955  MR#: [id number]  ZGY#:[id number]  No care team member to display  CHIEF COMPLAINT:  Chief Complaint  Patient presents with  . Follow-up  . Chemotherapy    INTERVAL HISTORY:  Patient returns to clinic today for further evaluation and consideration of cycle 2 of 3 induction taxotere, cisplatin, and 5-fu.  He was admitted to the hospital with neutropenic fevers, but this has since improved and he is back to his baseline. He currently feels well and is asymptomatic. He has no neurologic complaints. He has a good appetite and denies weight loss. He has no chest pain or shortness of breath. He denies any nausea, vomiting, cons patient, or diarrhea. He has no urinary complaints. Patient feels at his baseline and offers no specific complaints today.   REVIEW OF SYSTEMS:   Review of Systems  Constitutional: Negative.   Respiratory: Negative.   Cardiovascular: Negative.     As per HPI. Otherwise, a complete review of systems is negatve.  PAST MEDICAL HISTORY: Past Medical History  Diagnosis Date  . Arthritis   . Cancer     HEAD/NECK/TONGUE CANCER  . Colon cancer   . Depression   . Hyperlipidemia   . HTN (hypertension)   . Squamous cell cancer of tongue     PAST SURGICAL HISTORY: Past Surgical History  Procedure Laterality Date  . Colectomy    . Splenectomy      FAMILY HISTORY Family History  Problem Relation Age of Onset  . Hypercholesterolemia      FAMILY HX  . Head & neck cancer Father        ADVANCED DIRECTIVES:    HEALTH MAINTENANCE: History  Substance Use Topics  . Smoking status: Former Smoker    Types: Cigarettes  . Smokeless tobacco: Not on file     Comment: HEAVY TOBACCO USE. QUIT GREATER THAN 15 YRS AGO  . Alcohol Use: 0.0 oz/week    0 Standard drinks or equivalent per week     Comment: "HEAVY ALCOHOL USE"     Colonoscopy:  PAP:  Bone  density:  Lipid panel:  Allergies  Allergen Reactions  . Penicillin G     Other reaction(s): Itching of Skin, Localized superficial swelling of skin  . Penicillins Itching and Swelling  . Tramadol     Other reaction(s): Unknown    Current Outpatient Prescriptions  Medication Sig Dispense Refill  . Alum & Mag Hydroxide-Simeth (MAGIC MOUTHWASH) SOLN Take 5 mLs by mouth 4 (four) times daily. PRN MOUTH SORES    . amLODipine (NORVASC) 10 MG tablet Take 10 mg by mouth daily.    . cyclobenzaprine (FLEXERIL) 10 MG tablet Take 10 mg by mouth 3 (three) times daily as needed for muscle spasms.    . FENOFIBRATE PO Take 1 tablet by mouth 1 day or 1 dose.    [name] GABAPENTIN PO Take 1 capsule by mouth every 8 (eight) hours. DOSE UNKNOWN    . hydrochlorothiazide (MICROZIDE) 12.5 MG capsule Take 1 capsule by mouth 1 day or 1 dose.    . ibuprofen (ADVIL,MOTRIN) 800 MG tablet Take 1 tablet by mouth 3 (three) times daily as needed. PAIN    . lidocaine-prilocaine (EMLA) cream Apply to affected area once 30 g 3  . LISINOPRIL PO Take by mouth.    . mirtazapine (REMERON) 15 MG tablet Take 15 mg by mouth at bedtime.    . NON FORMULARY Take  5 mLs by mouth 4 (four) times daily as needed.    . Omega-3 Fatty Acids (FISH OIL) 1000 MG CAPS Take 1 capsule by mouth.    . Oxycodone HCl 10 MG TABS Take 10 mg by mouth every 8 (eight) hours as needed.    . prochlorperazine (COMPAZINE) 10 MG tablet Take by mouth.    . simvastatin (ZOCOR) 10 MG tablet Take by mouth.    . vitamin C (ASCORBIC ACID) 500 MG tablet Take by mouth 1 day or 1 dose.     No current facility-administered medications for this visit.    OBJECTIVE: Filed Vitals:   10/23/14 1023  BP: 112/71  Pulse: 89  Temp: 98.3 F (36.8 C)  Resp: 20     Body mass index is 24.79 kg/(m^2).    ECOG FS:0 - Asymptomatic  General: Well-developed, well-nourished, no acute distress. Eyes: anicteric sclera. HEENT: Decreased lymphadenopathy of left neck. Lungs: Clear  to auscultation bilaterally. Heart: Regular rate and rhythm. No rubs, murmurs, or gallops. Abdomen: Soft, nontender, nondistended. No organomegaly noted, normoactive bowel sounds. Musculoskeletal: No edema, cyanosis, or clubbing. Neuro: Alert, answering all questions appropriately. Cranial nerves grossly intact. Skin: No rashes or petechiae noted. Psych: Normal affect.    LAB RESULTS:  Lab Results  Component Value Date   NA 137 10/23/2014   K 4.5 10/23/2014   CL 101 10/23/2014   CO2 28 10/23/2014   GLUCOSE 115* 10/23/2014   BUN 22* 10/23/2014   CREATININE 1.08 10/23/2014   CALCIUM 8.6* 10/23/2014   PROT 6.7 10/23/2014   ALBUMIN 2.8* 10/23/2014   AST 25 10/23/2014   ALT 34 10/23/2014   ALKPHOS 98 10/23/2014   BILITOT 0.3 10/23/2014   GFRNONAA >60 10/23/2014   GFRAA >60 10/23/2014    Lab Results  Component Value Date   WBC 12.9* 10/23/2014   NEUTROABS 9.1* 10/23/2014   HGB 10.4* 10/23/2014   HCT 30.8* 10/23/2014   MCV 92.5 10/23/2014   PLT 602* 10/23/2014     STUDIES: Dg Chest Port 1 View  10/11/2014   CLINICAL DATA:  Weight loss, fever and shortness of breath. Recent diagnosis of head and neck cancer. Status post chemotherapy 2 weeks ago.  EXAM: PORTABLE CHEST - 1 VIEW  COMPARISON:  Single view of the chest 03/27/2014.  FINDINGS: Right IJ approach Port-A-Cath is in place with the tip projecting over the lower superior vena cava. The lungs are clear. Heart size is normal. No pneumothorax or pleural effusion. Right seventh rib fracture is identified as seen on the prior exam.  IMPRESSION: No acute disease.   Electronically Signed   By: Inge Rise M.D.   On: 10/11/2014 13:46    ASSESSMENT: Stage IVa squamous cell carcinoma of base of tongue.  PLAN:    1. Tongue cancer: Outside pathology, imaging, and physician notes reviewed independently. Proceed with cycle 2 of 3 of induction chemotherapy using Taxotere, cisplatin, and 5-FU. Return to clinic in 4 days for pump  removal and Neulasta, in 1 week for laboratory work only and then in 3 weeks for consideration of cycle 3.  Plan to do 3 cycles every 3 weeks followed by concurrent chemotherapy and XRT. Patient does not require feeding tube at this time, but may consider one in the future if he is unable to maintain his weight.  2. Neutropenic fevers: Resolved. Neulasta as above. 3. Anemia: Secondary chemotherapy, monitor.   Patient expressed understanding and was in agreement with this plan. He also understands that He  can call clinic at any time with any questions, concerns, or complaints.   Malignant neoplasm of base of tongue   Staging form: Lip and Oral Cavity, AJCC 7th Edition     Clinical stage from 10/18/2014: Stage IVA (T2, N2, M0) - Signed by Lloyd Huger, MD on 10/18/2014   Lloyd Huger, MD   10/27/2014 8:56 AM

## 2014-10-28 ENCOUNTER — Ambulatory Visit: Payer: Medicare Other

## 2014-10-28 VITALS — BP 111/75 | HR 108 | Temp 96.0°F | Resp 16

## 2014-10-28 DIAGNOSIS — Z5111 Encounter for antineoplastic chemotherapy: Secondary | ICD-10-CM | POA: Diagnosis not present

## 2014-10-28 MED ORDER — SODIUM CHLORIDE 0.9 % IJ SOLN
10.0000 mL | INTRAMUSCULAR | Status: DC | PRN
  Administered 2014-10-28: 10 mL
  Filled 2014-10-28: qty 10

## 2014-10-28 MED ORDER — HEPARIN SOD (PORK) LOCK FLUSH 100 UNIT/ML IV SOLN
500.0000 [IU] | Freq: Once | INTRAVENOUS | Status: AC | PRN
  Administered 2014-10-28: 500 [IU]

## 2014-10-28 MED ORDER — PEGFILGRASTIM INJECTION 6 MG/0.6ML ~~LOC~~
6.0000 mg | PREFILLED_SYRINGE | Freq: Once | SUBCUTANEOUS | Status: AC
  Administered 2014-10-28: 6 mg via SUBCUTANEOUS

## 2014-10-30 ENCOUNTER — Inpatient Hospital Stay: Payer: Medicare Other

## 2014-10-30 ENCOUNTER — Other Ambulatory Visit: Payer: Self-pay | Admitting: *Deleted

## 2014-10-30 DIAGNOSIS — Z5111 Encounter for antineoplastic chemotherapy: Secondary | ICD-10-CM | POA: Diagnosis not present

## 2014-10-30 DIAGNOSIS — C01 Malignant neoplasm of base of tongue: Secondary | ICD-10-CM

## 2014-10-30 LAB — CBC WITH DIFFERENTIAL/PLATELET
BASOS PCT: 1 %
Basophils Absolute: 0.1 10*3/uL (ref 0–0.1)
Eosinophils Absolute: 0 10*3/uL (ref 0–0.7)
Eosinophils Relative: 0 %
HCT: 39.9 % — ABNORMAL LOW (ref 40.0–52.0)
Hemoglobin: 12.8 g/dL — ABNORMAL LOW (ref 13.0–18.0)
Lymphocytes Relative: 24 %
Lymphs Abs: 1.5 10*3/uL (ref 1.0–3.6)
MCH: 30.2 pg (ref 26.0–34.0)
MCHC: 32.2 g/dL (ref 32.0–36.0)
MCV: 93.7 fL (ref 80.0–100.0)
Monocytes Absolute: 0.1 10*3/uL — ABNORMAL LOW (ref 0.2–1.0)
Monocytes Relative: 2 %
NEUTROS ABS: 4.5 10*3/uL (ref 1.4–6.5)
NEUTROS PCT: 73 %
Platelets: 398 10*3/uL (ref 150–440)
RBC: 4.26 MIL/uL — ABNORMAL LOW (ref 4.40–5.90)
RDW: 14 % (ref 11.5–14.5)
WBC: 6.2 10*3/uL (ref 3.8–10.6)

## 2014-10-30 LAB — COMPREHENSIVE METABOLIC PANEL
ALT: 27 U/L (ref 17–63)
AST: 22 U/L (ref 15–41)
Albumin: 3.9 g/dL (ref 3.5–5.0)
Alkaline Phosphatase: 123 U/L (ref 38–126)
Anion gap: 9 (ref 5–15)
BUN: 37 mg/dL — ABNORMAL HIGH (ref 6–20)
CO2: 26 mmol/L (ref 22–32)
Calcium: 9.3 mg/dL (ref 8.9–10.3)
Chloride: 99 mmol/L — ABNORMAL LOW (ref 101–111)
Creatinine, Ser: 1.21 mg/dL (ref 0.61–1.24)
GFR calc non Af Amer: >60 mL/min (ref >60)
Glucose, Bld: 109 mg/dL — ABNORMAL HIGH (ref 65–99)
Potassium: 4.5 mmol/L (ref 3.5–5.1)
SODIUM: 134 mmol/L — AB (ref 135–145)
TOTAL PROTEIN: 8 g/dL (ref 6.5–8.1)
Total Bilirubin: 0.7 mg/dL (ref 0.3–1.2)

## 2014-10-31 ENCOUNTER — Encounter: Payer: Self-pay | Admitting: *Deleted

## 2014-11-02 ENCOUNTER — Inpatient Hospital Stay (HOSPITAL_BASED_OUTPATIENT_CLINIC_OR_DEPARTMENT_OTHER): Payer: Medicare Other | Admitting: Oncology

## 2014-11-02 ENCOUNTER — Telehealth: Payer: Self-pay | Admitting: *Deleted

## 2014-11-02 ENCOUNTER — Inpatient Hospital Stay: Payer: Medicare Other

## 2014-11-02 ENCOUNTER — Other Ambulatory Visit: Payer: Self-pay | Admitting: *Deleted

## 2014-11-02 DIAGNOSIS — Z5111 Encounter for antineoplastic chemotherapy: Secondary | ICD-10-CM | POA: Diagnosis not present

## 2014-11-02 DIAGNOSIS — C01 Malignant neoplasm of base of tongue: Secondary | ICD-10-CM | POA: Diagnosis not present

## 2014-11-02 DIAGNOSIS — E785 Hyperlipidemia, unspecified: Secondary | ICD-10-CM | POA: Diagnosis not present

## 2014-11-02 DIAGNOSIS — Z87891 Personal history of nicotine dependence: Secondary | ICD-10-CM

## 2014-11-02 DIAGNOSIS — I10 Essential (primary) hypertension: Secondary | ICD-10-CM | POA: Diagnosis not present

## 2014-11-02 DIAGNOSIS — C760 Malignant neoplasm of head, face and neck: Secondary | ICD-10-CM

## 2014-11-02 DIAGNOSIS — M1990 Unspecified osteoarthritis, unspecified site: Secondary | ICD-10-CM

## 2014-11-02 DIAGNOSIS — Z79899 Other long term (current) drug therapy: Secondary | ICD-10-CM

## 2014-11-02 DIAGNOSIS — D6481 Anemia due to antineoplastic chemotherapy: Secondary | ICD-10-CM | POA: Diagnosis not present

## 2014-11-02 LAB — COMPREHENSIVE METABOLIC PANEL
ALBUMIN: 3.7 g/dL (ref 3.5–5.0)
ALT: 36 U/L (ref 17–63)
AST: 27 U/L (ref 15–41)
Alkaline Phosphatase: 112 U/L (ref 38–126)
Anion gap: 10 (ref 5–15)
BILIRUBIN TOTAL: 0.3 mg/dL (ref 0.3–1.2)
BUN: 54 mg/dL — ABNORMAL HIGH (ref 6–20)
CHLORIDE: 101 mmol/L (ref 101–111)
CO2: 24 mmol/L (ref 22–32)
Calcium: 9.2 mg/dL (ref 8.9–10.3)
Creatinine, Ser: 1.25 mg/dL — ABNORMAL HIGH (ref 0.61–1.24)
GFR calc Af Amer: >60 mL/min (ref >60)
GLUCOSE: 122 mg/dL — AB (ref 65–99)
Potassium: 4 mmol/L (ref 3.5–5.1)
SODIUM: 135 mmol/L (ref 135–145)
Total Protein: 7.5 g/dL (ref 6.5–8.1)

## 2014-11-02 LAB — CBC WITH DIFFERENTIAL/PLATELET
BLASTS: 0 %
Band Neutrophils: 14 % — ABNORMAL HIGH (ref 0–10)
Basophils Absolute: 0 10*3/uL (ref 0.0–0.1)
Basophils Relative: 0 % (ref 0–1)
EOS PCT: 0 % (ref 0–5)
Eosinophils Absolute: 0 10*3/uL (ref 0.0–0.7)
HCT: 38.9 % — ABNORMAL LOW (ref 40.0–52.0)
Hemoglobin: 12.9 g/dL — ABNORMAL LOW (ref 13.0–18.0)
LYMPHS ABS: 1.3 10*3/uL (ref 0.7–4.0)
Lymphocytes Relative: 13 % (ref 12–46)
MCH: 30.7 pg (ref 26.0–34.0)
MCHC: 33.1 g/dL (ref 32.0–36.0)
MCV: 92.7 fL (ref 80.0–100.0)
MONO ABS: 3.3 10*3/uL — AB (ref 0.1–1.0)
MONOS PCT: 34 % — AB (ref 3–12)
Metamyelocytes Relative: 2 %
Myelocytes: 0 %
NRBC: 1 /100{WBCs} — AB
Neutro Abs: 5.2 10*3/uL (ref 1.7–7.7)
Neutrophils Relative %: 37 % — ABNORMAL LOW (ref 43–77)
OTHER: 0 %
PLATELETS: 170 10*3/uL (ref 150–440)
Promyelocytes Absolute: 0 %
RBC: 4.2 MIL/uL — ABNORMAL LOW (ref 4.40–5.90)
RDW: 13.7 % (ref 11.5–14.5)
SMEAR REVIEW: ADEQUATE
WBC: 9.8 10*3/uL (ref 3.8–10.6)

## 2014-11-02 LAB — MAGNESIUM: MAGNESIUM: 2 mg/dL (ref 1.7–2.4)

## 2014-11-02 MED ORDER — SODIUM CHLORIDE 0.9 % IV SOLN
Freq: Once | INTRAVENOUS | Status: AC
  Administered 2014-11-02: 15:00:00 via INTRAVENOUS
  Filled 2014-11-02: qty 1000

## 2014-11-02 MED ORDER — DEXAMETHASONE SODIUM PHOSPHATE 100 MG/10ML IJ SOLN
Freq: Once | INTRAMUSCULAR | Status: AC
  Administered 2014-11-02: 15:00:00 via INTRAVENOUS
  Filled 2014-11-02: qty 4

## 2014-11-02 MED ORDER — SODIUM CHLORIDE 0.9 % IV SOLN
Freq: Once | INTRAVENOUS | Status: DC
  Filled 2014-11-02: qty 1000

## 2014-11-02 NOTE — Telephone Encounter (Signed)
Patient called requesting appt for today "I feel like shit, can't keep anything on my stomach and running off too"Appt for 1:45 for lab/ md/ivf agred on by pt.

## 2014-11-03 ENCOUNTER — Telehealth: Payer: Self-pay | Admitting: *Deleted

## 2014-11-03 ENCOUNTER — Inpatient Hospital Stay: Payer: Medicare Other

## 2014-11-03 DIAGNOSIS — C01 Malignant neoplasm of base of tongue: Secondary | ICD-10-CM

## 2014-11-03 DIAGNOSIS — Z5111 Encounter for antineoplastic chemotherapy: Secondary | ICD-10-CM | POA: Diagnosis not present

## 2014-11-03 MED ORDER — SODIUM CHLORIDE 0.9 % IJ SOLN
10.0000 mL | INTRAMUSCULAR | Status: AC | PRN
  Administered 2014-11-03: 10 mL via INTRAVENOUS
  Filled 2014-11-03: qty 10

## 2014-11-03 MED ORDER — HEPARIN SOD (PORK) LOCK FLUSH 100 UNIT/ML IV SOLN
500.0000 [IU] | Freq: Once | INTRAVENOUS | Status: AC
  Administered 2014-11-03: 500 [IU] via INTRAVENOUS
  Filled 2014-11-03: qty 5

## 2014-11-03 MED ORDER — SODIUM CHLORIDE 0.9 % IV SOLN
Freq: Once | INTRAVENOUS | Status: AC
  Administered 2014-11-03: 10:00:00 via INTRAVENOUS
  Filled 2014-11-03: qty 4

## 2014-11-03 MED ORDER — SODIUM CHLORIDE 0.9 % IV SOLN
Freq: Once | INTRAVENOUS | Status: AC
  Administered 2014-11-03: 10:00:00 via INTRAVENOUS
  Filled 2014-11-03: qty 1000

## 2014-11-03 NOTE — Telephone Encounter (Signed)
Was told he could come for more IVF today if needed and he is still unable to keep anything down He is on his way for IVF now

## 2014-11-10 ENCOUNTER — Other Ambulatory Visit: Payer: Self-pay | Admitting: Oncology

## 2014-11-14 ENCOUNTER — Inpatient Hospital Stay: Payer: Medicare Other

## 2014-11-14 ENCOUNTER — Inpatient Hospital Stay (HOSPITAL_BASED_OUTPATIENT_CLINIC_OR_DEPARTMENT_OTHER): Payer: Medicare Other | Admitting: Oncology

## 2014-11-14 VITALS — BP 135/84 | HR 91 | Temp 97.6°F | Wt 158.7 lb

## 2014-11-14 VITALS — BP 121/74 | HR 86 | Resp 18

## 2014-11-14 DIAGNOSIS — I10 Essential (primary) hypertension: Secondary | ICD-10-CM

## 2014-11-14 DIAGNOSIS — Z87891 Personal history of nicotine dependence: Secondary | ICD-10-CM

## 2014-11-14 DIAGNOSIS — M1990 Unspecified osteoarthritis, unspecified site: Secondary | ICD-10-CM

## 2014-11-14 DIAGNOSIS — Z7982 Long term (current) use of aspirin: Secondary | ICD-10-CM

## 2014-11-14 DIAGNOSIS — Z79899 Other long term (current) drug therapy: Secondary | ICD-10-CM

## 2014-11-14 DIAGNOSIS — R5383 Other fatigue: Secondary | ICD-10-CM | POA: Diagnosis not present

## 2014-11-14 DIAGNOSIS — C760 Malignant neoplasm of head, face and neck: Secondary | ICD-10-CM

## 2014-11-14 DIAGNOSIS — R5381 Other malaise: Secondary | ICD-10-CM | POA: Diagnosis not present

## 2014-11-14 DIAGNOSIS — C01 Malignant neoplasm of base of tongue: Secondary | ICD-10-CM | POA: Diagnosis not present

## 2014-11-14 DIAGNOSIS — D6481 Anemia due to antineoplastic chemotherapy: Secondary | ICD-10-CM

## 2014-11-14 DIAGNOSIS — Z5111 Encounter for antineoplastic chemotherapy: Secondary | ICD-10-CM | POA: Diagnosis not present

## 2014-11-14 DIAGNOSIS — E785 Hyperlipidemia, unspecified: Secondary | ICD-10-CM

## 2014-11-14 LAB — CBC WITH DIFFERENTIAL/PLATELET
Basophils Absolute: 0.1 10*3/uL (ref 0–0.1)
Basophils Relative: 1 %
EOS ABS: 0 10*3/uL (ref 0–0.7)
Eosinophils Relative: 0 %
HEMATOCRIT: 33.3 % — AB (ref 40.0–52.0)
HEMOGLOBIN: 11.2 g/dL — AB (ref 13.0–18.0)
Lymphocytes Relative: 32 %
Lymphs Abs: 2.8 10*3/uL (ref 1.0–3.6)
MCH: 31.1 pg (ref 26.0–34.0)
MCHC: 33.5 g/dL (ref 32.0–36.0)
MCV: 92.8 fL (ref 80.0–100.0)
MONO ABS: 1.7 10*3/uL — AB (ref 0.2–1.0)
MONOS PCT: 19 %
Neutro Abs: 4.3 10*3/uL (ref 1.4–6.5)
Neutrophils Relative %: 48 %
Platelets: 407 10*3/uL (ref 150–440)
RBC: 3.59 MIL/uL — ABNORMAL LOW (ref 4.40–5.90)
RDW: 14.3 % (ref 11.5–14.5)
WBC: 8.9 10*3/uL (ref 3.8–10.6)

## 2014-11-14 LAB — COMPREHENSIVE METABOLIC PANEL
ALBUMIN: 3.7 g/dL (ref 3.5–5.0)
ALT: 16 U/L — ABNORMAL LOW (ref 17–63)
ANION GAP: 7 (ref 5–15)
AST: 21 U/L (ref 15–41)
Alkaline Phosphatase: 95 U/L (ref 38–126)
BUN: 27 mg/dL — AB (ref 6–20)
CHLORIDE: 104 mmol/L (ref 101–111)
CO2: 25 mmol/L (ref 22–32)
Calcium: 8.8 mg/dL — ABNORMAL LOW (ref 8.9–10.3)
Creatinine, Ser: 0.83 mg/dL (ref 0.61–1.24)
GFR calc Af Amer: >60 mL/min (ref >60)
GFR calc non Af Amer: >60 mL/min (ref >60)
Glucose, Bld: 99 mg/dL (ref 65–99)
Potassium: 4.2 mmol/L (ref 3.5–5.1)
Sodium: 136 mmol/L (ref 135–145)
Total Bilirubin: 0.3 mg/dL (ref 0.3–1.2)
Total Protein: 6.7 g/dL (ref 6.5–8.1)

## 2014-11-14 MED ORDER — PALONOSETRON HCL INJECTION 0.25 MG/5ML
0.2500 mg | Freq: Once | INTRAVENOUS | Status: AC
Start: 2014-11-14 — End: 2014-11-14
  Administered 2014-11-14: 0.25 mg via INTRAVENOUS

## 2014-11-14 MED ORDER — DIPHENHYDRAMINE HCL 50 MG/ML IJ SOLN
25.0000 mg | Freq: Once | INTRAMUSCULAR | Status: AC
  Administered 2014-11-14: 25 mg via INTRAVENOUS
  Filled 2014-11-14: qty 1

## 2014-11-14 MED ORDER — DOCETAXEL CHEMO INJECTION 160 MG/16ML
60.0000 mg/m2 | Freq: Once | INTRAVENOUS | Status: AC
  Administered 2014-11-14: 110 mg via INTRAVENOUS
  Filled 2014-11-14: qty 11

## 2014-11-14 MED ORDER — FAMOTIDINE IN NACL 20-0.9 MG/50ML-% IV SOLN
20.0000 mg | Freq: Once | INTRAVENOUS | Status: AC
  Administered 2014-11-14: 20 mg via INTRAVENOUS
  Filled 2014-11-14: qty 50

## 2014-11-14 MED ORDER — SODIUM CHLORIDE 0.9 % IV SOLN
80.0000 mg/m2 | Freq: Once | INTRAVENOUS | Status: AC
  Administered 2014-11-14: 153 mg via INTRAVENOUS
  Filled 2014-11-14: qty 153

## 2014-11-14 MED ORDER — SODIUM CHLORIDE 0.9 % IV SOLN
Freq: Once | INTRAVENOUS | Status: AC
  Administered 2014-11-14: 11:00:00 via INTRAVENOUS
  Filled 2014-11-14: qty 1000

## 2014-11-14 MED ORDER — SODIUM CHLORIDE 0.9 % IV SOLN
800.0000 mg/m2/d | INTRAVENOUS | Status: AC
  Administered 2014-11-14: 6100 mg via INTRAVENOUS
  Filled 2014-11-14: qty 122

## 2014-11-14 MED ORDER — SODIUM CHLORIDE 0.9 % IV SOLN
Freq: Once | INTRAVENOUS | Status: AC
  Administered 2014-11-14: 14:00:00 via INTRAVENOUS
  Filled 2014-11-14: qty 5

## 2014-11-14 MED ORDER — HEPARIN SOD (PORK) LOCK FLUSH 100 UNIT/ML IV SOLN: 500.0000 [IU] | Freq: Once | INTRAVENOUS | Status: AC | PRN

## 2014-11-14 MED ORDER — POTASSIUM CHLORIDE 2 MEQ/ML IV SOLN
Freq: Once | INTRAVENOUS | Status: AC
  Administered 2014-11-14: 12:00:00 via INTRAVENOUS
  Filled 2014-11-14: qty 1000

## 2014-11-17 NOTE — Progress Notes (Signed)
[place]  [phone] [phone]) [phone]  ID: Bonnita Levan OB: [birth year]  MR#: [id number]  IRW#:[id number]  Patient Care Team: Emelia Loron, MD as PCP - General (Family Medicine)  CHIEF COMPLAINT:  Chief Complaint  Patient presents with  . Follow-up    Tongue cancer    INTERVAL HISTORY:  Patient returns to clinic today for further evaluation and consideration of cycle 3 of 3 induction taxotere, cisplatin, and 5-fu.  His appetite has improved. He does not complain of weakness or fatigue today. He denies any fevers. He currently feels well and is asymptomatic. He has no neurologic complaints. He has no chest pain or shortness of breath. He denies any nausea, vomiting, constipation, or diarrhea. He has no urinary complaints. Patient feels at his baseline and offers no specific complaints today.   REVIEW OF SYSTEMS:   Review of Systems  Constitutional: Positive for malaise/fatigue. Negative for fever and weight loss.  Respiratory: Negative.   Cardiovascular: Negative.     As per HPI. Otherwise, a complete review of systems is negatve.  PAST MEDICAL HISTORY: Past Medical History  Diagnosis Date  . Arthritis   . Cancer     HEAD/NECK/TONGUE CANCER  . Colon cancer   . Depression   . Hyperlipidemia   . HTN (hypertension)   . Squamous cell cancer of tongue     PAST SURGICAL HISTORY: Past Surgical History  Procedure Laterality Date  . Colectomy    . Splenectomy      FAMILY HISTORY Family History  Problem Relation Age of Onset  . Hypercholesterolemia      FAMILY HX  . Head & neck cancer Father        ADVANCED DIRECTIVES:    HEALTH MAINTENANCE: History  Substance Use Topics  . Smoking status: Former Smoker    Types: Cigarettes  . Smokeless tobacco: Not on file     Comment: HEAVY TOBACCO USE. QUIT GREATER THAN 15 YRS AGO  . Alcohol Use: 0.0 oz/week    0 Standard drinks or equivalent per week     Comment: "HEAVY ALCOHOL  USE"     Colonoscopy:  PAP:  Bone density:  Lipid panel:  Allergies  Allergen Reactions  . Penicillin G     Other reaction(s): Itching of Skin, Localized superficial swelling of skin  . Penicillins Itching and Swelling  . Tramadol     Other reaction(s): Unknown    Current Outpatient Prescriptions  Medication Sig Dispense Refill  . Alum & Mag Hydroxide-Simeth (MAGIC MOUTHWASH) SOLN Take 5 mLs by mouth 4 (four) times daily. PRN MOUTH SORES    . amLODipine (NORVASC) 10 MG tablet Take 10 mg by mouth daily.    . cyclobenzaprine (FLEXERIL) 10 MG tablet Take 10 mg by mouth 3 (three) times daily as needed for muscle spasms.    . FENOFIBRATE PO Take 1 tablet by mouth 1 day or 1 dose.    . gabapentin (NEURONTIN) 600 MG tablet Take 600 mg by mouth 3 (three) times daily.  3  . hydrochlorothiazide (MICROZIDE) 12.5 MG capsule Take 1 capsule by mouth 1 day or 1 dose.    . ibuprofen (ADVIL,MOTRIN) 800 MG tablet Take 1 tablet by mouth 3 (three) times daily as needed. PAIN    . lidocaine-prilocaine (EMLA) cream Apply to affected area once 30 g 3  . lisinopril (PRINIVIL,ZESTRIL) 40 MG tablet Take 40 mg by mouth daily.  5  . mirtazapine (REMERON) 15 MG tablet Take 15 mg by mouth  at bedtime.    . Omega-3 Fatty Acids (FISH OIL) 1000 MG CAPS Take 1 capsule by mouth.    . Oxycodone HCl 10 MG TABS Take 10 mg by mouth every 8 (eight) hours as needed.    . prochlorperazine (COMPAZINE) 10 MG tablet Take by mouth.    . simvastatin (ZOCOR) 20 MG tablet Take 20 mg by mouth at bedtime.  5  . vitamin C (ASCORBIC ACID) 500 MG tablet Take by mouth 1 day or 1 dose.    . NON FORMULARY Take 5 mLs by mouth 4 (four) times daily as needed.     No current facility-administered medications for this visit.   Facility-Administered Medications Ordered in Other Visits  Medication Dose Route Frequency Provider Last Rate Last Dose  . fluorouracil (ADRUCIL) 6,100 mg in sodium chloride 0.9 % 128 mL chemo infusion  800  mg/m2/day (Treatment Plan Actual) Intravenous 4 days Lloyd Huger, MD   6,100 mg at 11/14/14 1735  . sodium chloride 0.9 % injection 10 mL  10 mL Intravenous PRN Lloyd Huger, MD   10 mL at 11/03/14 1005    OBJECTIVE: Filed Vitals:   11/14/14 1051  BP: 135/84  Pulse: 91  Temp: 97.6 F (36.4 C)     Body mass index is 23.46 kg/(m^2).    ECOG FS:0 - Asymptomatic  General: Well-developed, well-nourished, no acute distress. Eyes: anicteric sclera. HEENT: Decreased lymphadenopathy of left neck. Lungs: Clear to auscultation bilaterally. Heart: Regular rate and rhythm. No rubs, murmurs, or gallops. Abdomen: Soft, nontender, nondistended. No organomegaly noted, normoactive bowel sounds. Musculoskeletal: No edema, cyanosis, or clubbing. Neuro: Alert, answering all questions appropriately. Cranial nerves grossly intact. Skin: No rashes or petechiae noted. Psych: Normal affect.    LAB RESULTS:  Lab Results  Component Value Date   NA 136 11/14/2014   K 4.2 11/14/2014   CL 104 11/14/2014   CO2 25 11/14/2014   GLUCOSE 99 11/14/2014   BUN 27* 11/14/2014   CREATININE 0.83 11/14/2014   CALCIUM 8.8* 11/14/2014   PROT 6.7 11/14/2014   ALBUMIN 3.7 11/14/2014   AST 21 11/14/2014   ALT 16* 11/14/2014   ALKPHOS 95 11/14/2014   BILITOT 0.3 11/14/2014   GFRNONAA >60 11/14/2014   GFRAA >60 11/14/2014    Lab Results  Component Value Date   WBC 8.9 11/14/2014   NEUTROABS 4.3 11/14/2014   HGB 11.2* 11/14/2014   HCT 33.3* 11/14/2014   MCV 92.8 11/14/2014   PLT 407 11/14/2014     STUDIES: No results found.  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  PLAN:    1. Tongue cancer: Outside pathology, imaging, and physician notes reviewed independently. Proceed with cycle 3 of 3 of induction chemotherapy using Taxotere, cisplatin, and 5-FU. Will dose reduce approximately 10-15% given his difficulties with cycle 2.  Return to clinic in 4 days for pump  removal and Neulasta. Patient will then return to clinic in 2-3 weeks to initiate concurrent XRT and chemotherapy likely using weekly cisplatin.  2. Neutropenic fevers: Resolved. Neulasta as above. 3. Anemia: Secondary chemotherapy, monitor.   Patient expressed understanding and was in agreement with this plan. He also understands that He can call clinic at any time with any questions, concerns, or complaints.   Malignant neoplasm of base of tongue   Staging form: Lip and Oral Cavity, AJCC 7th Edition     Clinical stage from 10/18/2014: Stage IVA (T2, N2, M0) - Signed by Kathlene November  Grayland Ormond, MD on 10/18/2014   Lloyd Huger, MD   11/17/2014 1:57 PM

## 2014-11-17 NOTE — Progress Notes (Signed)
[place]  Telephone:(336) [phone] Fax:(336) [phone]  ID: Mathew Robinson OB: May 15, 1956  MR#: [id number]  PFX#:[id number]  Patient Care Team: Emelia Loron, MD as PCP - General (Family Medicine)  CHIEF COMPLAINT:  No chief complaint on file.   INTERVAL HISTORY:  Patient returns to clinic today as an add-on with complaints of poor appetite and increasing weakness and fatigue. He denies any fevers. He has no neurologic complaints. He has no chest pain or shortness of breath. He denies any nausea, vomiting, constipation, or diarrhea. He has no urinary complaints. Patient offers no further specific complaints today.   REVIEW OF SYSTEMS:   Review of Systems  Constitutional: Positive for weight loss and malaise/fatigue. Negative for fever.  Respiratory: Negative.   Cardiovascular: Negative.   Neurological: Positive for weakness.    As per HPI. Otherwise, a complete review of systems is negatve.  PAST MEDICAL HISTORY: Past Medical History  Diagnosis Date  . Arthritis   . Cancer     HEAD/NECK/TONGUE CANCER  . Colon cancer   . Depression   . Hyperlipidemia   . HTN (hypertension)   . Squamous cell cancer of tongue     PAST SURGICAL HISTORY: Past Surgical History  Procedure Laterality Date  . Colectomy    . Splenectomy      FAMILY HISTORY Family History  Problem Relation Age of Onset  . Hypercholesterolemia      FAMILY HX  . Head & neck cancer Father        ADVANCED DIRECTIVES:    HEALTH MAINTENANCE: History  Substance Use Topics  . Smoking status: Former Smoker    Types: Cigarettes  . Smokeless tobacco: Not on file     Comment: HEAVY TOBACCO USE. QUIT GREATER THAN 15 YRS AGO  . Alcohol Use: 0.0 oz/week    0 Standard drinks or equivalent per week     Comment: "HEAVY ALCOHOL USE"     Colonoscopy:  PAP:  Bone density:  Lipid panel:  Allergies  Allergen Reactions  . Penicillin G     Other reaction(s): Itching of Skin, Localized  superficial swelling of skin  . Penicillins Itching and Swelling  . Tramadol     Other reaction(s): Unknown    Current Outpatient Prescriptions  Medication Sig Dispense Refill  . Alum & Mag Hydroxide-Simeth (MAGIC MOUTHWASH) SOLN Take 5 mLs by mouth 4 (four) times daily. PRN MOUTH SORES    . amLODipine (NORVASC) 10 MG tablet Take 10 mg by mouth daily.    . cyclobenzaprine (FLEXERIL) 10 MG tablet Take 10 mg by mouth 3 (three) times daily as needed for muscle spasms.    . FENOFIBRATE PO Take 1 tablet by mouth 1 day or 1 dose.    . gabapentin (NEURONTIN) 600 MG tablet Take 600 mg by mouth 3 (three) times daily.  3  . hydrochlorothiazide (MICROZIDE) 12.5 MG capsule Take 1 capsule by mouth 1 day or 1 dose.    . ibuprofen (ADVIL,MOTRIN) 800 MG tablet Take 1 tablet by mouth 3 (three) times daily as needed. PAIN    . lidocaine-prilocaine (EMLA) cream Apply to affected area once 30 g 3  . lisinopril (PRINIVIL,ZESTRIL) 40 MG tablet Take 40 mg by mouth daily.  5  . mirtazapine (REMERON) 15 MG tablet Take 15 mg by mouth at bedtime.    . NON FORMULARY Take 5 mLs by mouth 4 (four) times daily as needed.    . Omega-3 Fatty Acids (FISH OIL) 1000 MG CAPS Take  1 capsule by mouth.    . Oxycodone HCl 10 MG TABS Take 10 mg by mouth every 8 (eight) hours as needed.    . prochlorperazine (COMPAZINE) 10 MG tablet Take by mouth.    . simvastatin (ZOCOR) 20 MG tablet Take 20 mg by mouth at bedtime.  5  . vitamin C (ASCORBIC ACID) 500 MG tablet Take by mouth 1 day or 1 dose.     No current facility-administered medications for this visit.   Facility-Administered Medications Ordered in Other Visits  Medication Dose Route Frequency Provider Last Rate Last Dose  . fluorouracil (ADRUCIL) 6,100 mg in sodium chloride 0.9 % 128 mL chemo infusion  800 mg/m2/day (Treatment Plan Actual) Intravenous 4 days Lloyd Huger, MD   6,100 mg at 11/14/14 1735  . sodium chloride 0.9 % injection 10 mL  10 mL Intravenous PRN  Lloyd Huger, MD   10 mL at 11/03/14 1005    OBJECTIVE: There were no vitals filed for this visit.   There is no weight on file to calculate BMI.    ECOG FS:1 - Symptomatic but completely ambulatory  General: Well-developed, well-nourished, no acute distress. Eyes: anicteric sclera. HEENT: Decreased lymphadenopathy of left neck. Lungs: Clear to auscultation bilaterally. Heart: Regular rate and rhythm. No rubs, murmurs, or gallops. Abdomen: Soft, nontender, nondistended. No organomegaly noted, normoactive bowel sounds. Musculoskeletal: No edema, cyanosis, or clubbing. Neuro: Alert, answering all questions appropriately. Cranial nerves grossly intact. Skin: No rashes or petechiae noted. Psych: Normal affect.    LAB RESULTS:  Lab Results  Component Value Date   NA 136 11/14/2014   K 4.2 11/14/2014   CL 104 11/14/2014   CO2 25 11/14/2014   GLUCOSE 99 11/14/2014   BUN 27* 11/14/2014   CREATININE 0.83 11/14/2014   CALCIUM 8.8* 11/14/2014   PROT 6.7 11/14/2014   ALBUMIN 3.7 11/14/2014   AST 21 11/14/2014   ALT 16* 11/14/2014   ALKPHOS 95 11/14/2014   BILITOT 0.3 11/14/2014   GFRNONAA >60 11/14/2014   GFRAA >60 11/14/2014    Lab Results  Component Value Date   WBC 8.9 11/14/2014   NEUTROABS 4.3 11/14/2014   HGB 11.2* 11/14/2014   HCT 33.3* 11/14/2014   MCV 92.8 11/14/2014   PLT 407 11/14/2014     STUDIES: No results found.  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  PLAN:    1. Tongue cancer: Outside pathology, imaging, and physician notes reviewed independently. Patient received cycle 2 of 3 of induction chemotherapy using Taxotere, cisplatin, and 5-FU approximately 1 week ago. Return as previously scheduled for cycle 3.  Plan to do 3 cycles every 3 weeks followed by concurrent chemotherapy and XRT. Patient does not require feeding tube at this time, but may consider one in the future if he is unable to maintain his weight.  2.  Neutropenic fevers: Resolved. Neulasta as above. 3. Anemia: Secondary chemotherapy, monitor. 4. Poor appetite: Patient will receive 1 L IV fluids today.   Patient expressed understanding and was in agreement with this plan. He also understands that He can call clinic at any time with any questions, concerns, or complaints.   Malignant neoplasm of base of tongue   Staging form: Lip and Oral Cavity, AJCC 7th Edition     Clinical stage from 10/18/2014: Stage IVA (T2, N2, M0) - Signed by Lloyd Huger, MD on 10/18/2014   Lloyd Huger, MD   11/17/2014 1:50 PM

## 2014-11-18 ENCOUNTER — Ambulatory Visit: Payer: Medicare Other | Admitting: *Deleted

## 2014-11-18 ENCOUNTER — Inpatient Hospital Stay: Payer: Medicare Other | Attending: Oncology

## 2014-11-18 DIAGNOSIS — C01 Malignant neoplasm of base of tongue: Secondary | ICD-10-CM | POA: Insufficient documentation

## 2014-11-18 DIAGNOSIS — I959 Hypotension, unspecified: Secondary | ICD-10-CM | POA: Insufficient documentation

## 2014-11-18 DIAGNOSIS — I10 Essential (primary) hypertension: Secondary | ICD-10-CM | POA: Diagnosis not present

## 2014-11-18 DIAGNOSIS — Z87891 Personal history of nicotine dependence: Secondary | ICD-10-CM | POA: Diagnosis not present

## 2014-11-18 DIAGNOSIS — Z418 Encounter for other procedures for purposes other than remedying health state: Secondary | ICD-10-CM | POA: Insufficient documentation

## 2014-11-18 DIAGNOSIS — E785 Hyperlipidemia, unspecified: Secondary | ICD-10-CM | POA: Insufficient documentation

## 2014-11-18 DIAGNOSIS — Z79899 Other long term (current) drug therapy: Secondary | ICD-10-CM | POA: Insufficient documentation

## 2014-11-18 DIAGNOSIS — Z5111 Encounter for antineoplastic chemotherapy: Secondary | ICD-10-CM | POA: Diagnosis not present

## 2014-11-18 DIAGNOSIS — D649 Anemia, unspecified: Secondary | ICD-10-CM | POA: Insufficient documentation

## 2014-11-18 DIAGNOSIS — IMO0002 Reserved for concepts with insufficient information to code with codable children: Secondary | ICD-10-CM

## 2014-11-18 DIAGNOSIS — M129 Arthropathy, unspecified: Secondary | ICD-10-CM | POA: Insufficient documentation

## 2014-11-18 DIAGNOSIS — Z85038 Personal history of other malignant neoplasm of large intestine: Secondary | ICD-10-CM | POA: Insufficient documentation

## 2014-11-18 DIAGNOSIS — R944 Abnormal results of kidney function studies: Secondary | ICD-10-CM | POA: Insufficient documentation

## 2014-11-18 DIAGNOSIS — F329 Major depressive disorder, single episode, unspecified: Secondary | ICD-10-CM | POA: Diagnosis not present

## 2014-11-18 MED ORDER — HEPARIN SOD (PORK) LOCK FLUSH 100 UNIT/ML IV SOLN
500.0000 [IU] | Freq: Once | INTRAVENOUS | Status: AC
Start: 2014-11-18 — End: 2014-11-18
  Administered 2014-11-18: 500 [IU] via INTRAVENOUS

## 2014-11-18 MED ORDER — SODIUM CHLORIDE 0.9 % IJ SOLN
10.0000 mL | Freq: Once | INTRAMUSCULAR | Status: AC
  Administered 2014-11-18: 10 mL via INTRAVENOUS
  Filled 2014-11-18: qty 10

## 2014-11-18 MED ORDER — PEGFILGRASTIM INJECTION 6 MG/0.6ML ~~LOC~~
6.0000 mg | PREFILLED_SYRINGE | Freq: Once | SUBCUTANEOUS | Status: AC
  Administered 2014-11-18: 6 mg via SUBCUTANEOUS

## 2014-11-22 ENCOUNTER — Ambulatory Visit
Admission: RE | Admit: 2014-11-22 | Discharge: 2014-11-22 | Disposition: A | Discharge status: Home / Self Care | Payer: Medicare Other | Source: Ambulatory Visit | Attending: Radiation Oncology | Admitting: Radiation Oncology

## 2014-11-22 ENCOUNTER — Encounter: Payer: Self-pay | Admitting: Radiation Oncology

## 2014-11-22 ENCOUNTER — Other Ambulatory Visit: Payer: Self-pay

## 2014-11-22 VITALS — BP 111/74 | HR 109 | Temp 96.2°F | Resp 20 | Wt 154.2 lb

## 2014-11-22 DIAGNOSIS — C01 Malignant neoplasm of base of tongue: Secondary | ICD-10-CM | POA: Insufficient documentation

## 2014-11-22 DIAGNOSIS — Z510 Encounter for antineoplastic radiation therapy: Secondary | ICD-10-CM | POA: Insufficient documentation

## 2014-11-22 MED ORDER — OXYCODONE HCL 10 MG PO TABS: 10.0000 mg | ORAL_TABLET | Freq: Three times a day (TID) | ORAL | Status: DC | PRN

## 2014-11-22 NOTE — Progress Notes (Signed)
Radiation Oncology Follow up Note  Name: Mathew Robinson   Date:   11/22/2014 MRN:  [id number] DOB: 12/11/1955    This 59 y.o. male presents to the clinic today for follow-up for head and neck cancer stage for a (T2 N2 B M0) squamous cell carcinoma the left base of tongue.  REFERRING PROVIDER: Emelia Loron, MD  HPI: Patient is a 59 year old male originally consult the back in April 2016 when he presented with a left sub-sub-digastric high cervical node growing in size measuring at least 2 cm biopsy positive for metastatic squamous cell carcinoma PET/CT demonstrated hypermetabolic activity in the left base of tongue vallecular region and still some residual hypermetabolic activity in the region of the lymph node dissection. He was started on induction chemotherapy by medical oncology receiving. Taxotere cis-platinum and 5-FU for 3 cycles. This was comp [place] by neutropenic fevers anemia and fatigue. He is doing fairly well at this time is having some minor dysphasia and head and neck pain which is narcotic dependent. He is seen today to begin radiation therapy with curative intent.  COMPLICATIONS OF TREATMENT: none  FOLLOW UP COMPLIANCE: keeps appointments   PHYSICAL EXAM:  BP 111/74 mmHg  Pulse 109  Temp(Src) 96.2 F (35.7 C)  Resp 20  Wt 154 lb 3.4 oz (69.95 kg) Well-developed thin male in NAD oral cavity patient wears dentures no oral mucosal lesions are identified. On indirect mirror examination he has slight asymmetry of the left tongue base. He still has some subtle high cervical adenopathy although it is markedly diminished in size. No other cervical or supraclavicular adenopathy is identified. Well-developed well-nourished patient in NAD. HEENT reveals PERLA, EOMI, discs not visualized.  Oral cavity is clear. No oral mucosal lesions are identified. Neck is clear without evidence of cervical or supraclavicular adenopathy. Lungs are clear to A&P. Cardiac examination is essentially  unremarkable with regular rate and rhythm without murmur rub or thrill. Abdomen is benign with no organomegaly or masses noted. Motor sensory and DTR levels are equal and symmetric in the upper and lower extremities. Cranial nerves II through XII are grossly intact. Proprioception is intact. No peripheral adenopathy or edema is identified. No motor or sensory levels are noted. Crude visual fields are within normal range.   RADIOLOGY RESULTS: PET CT scans have been requested for my review  PLAN: At this time like to go ahead with IM RT radiation therapy to both his base of tongue area of primary nodal involvement 2 7000 cGy over 7 weeks. Will treat his remaining nodal basin at risk to 5400 cGy using I am RT dose painting technique. Risks and benefits of treatment including possible xerostomia, fatigue, oral mucositis, dysphasia, alteration of blood counts, skin reaction all were reviewed in detail with the patient. He seems to comprehend my treatment plan well. We'll plan on giving concurrent weekly chemotherapy along with radiation treatments. I have set up and ordered CT simulation early next week. I've asked medical oncology to renew his narcotic pain medication. I have requested overnight of his PET/CT scan for my review and for treatment planning.  I would like to take this opportunity for allowing me to participate in the care of your patient.Armstead Peaks., MD

## 2014-11-28 ENCOUNTER — Ambulatory Visit
Admission: RE | Admit: 2014-11-28 | Discharge: 2014-11-28 | Disposition: A | Discharge status: Home / Self Care | Payer: Medicare Other | Source: Ambulatory Visit | Attending: Radiation Oncology | Admitting: Radiation Oncology

## 2014-11-28 DIAGNOSIS — C01 Malignant neoplasm of base of tongue: Secondary | ICD-10-CM | POA: Diagnosis not present

## 2014-11-28 DIAGNOSIS — Z510 Encounter for antineoplastic radiation therapy: Secondary | ICD-10-CM | POA: Diagnosis present

## 2014-12-06 DIAGNOSIS — Z510 Encounter for antineoplastic radiation therapy: Secondary | ICD-10-CM | POA: Diagnosis not present

## 2014-12-07 ENCOUNTER — Ambulatory Visit
Admission: RE | Admit: 2014-12-07 | Discharge: 2014-12-07 | Disposition: A | Discharge status: Home / Self Care | Payer: Medicare Other | Source: Ambulatory Visit | Attending: Radiation Oncology | Admitting: Radiation Oncology

## 2014-12-07 DIAGNOSIS — Z510 Encounter for antineoplastic radiation therapy: Secondary | ICD-10-CM | POA: Diagnosis not present

## 2014-12-11 ENCOUNTER — Other Ambulatory Visit: Payer: Self-pay | Admitting: Oncology

## 2014-12-11 ENCOUNTER — Ambulatory Visit
Admission: RE | Admit: 2014-12-11 | Discharge: 2014-12-11 | Disposition: A | Discharge status: Home / Self Care | Payer: Medicare Other | Source: Ambulatory Visit | Attending: Radiation Oncology | Admitting: Radiation Oncology

## 2014-12-11 DIAGNOSIS — Z510 Encounter for antineoplastic radiation therapy: Secondary | ICD-10-CM | POA: Diagnosis not present

## 2014-12-12 ENCOUNTER — Other Ambulatory Visit: Payer: Self-pay | Admitting: *Deleted

## 2014-12-12 ENCOUNTER — Inpatient Hospital Stay (HOSPITAL_BASED_OUTPATIENT_CLINIC_OR_DEPARTMENT_OTHER): Payer: Medicare Other | Admitting: Oncology

## 2014-12-12 ENCOUNTER — Ambulatory Visit
Admission: RE | Admit: 2014-12-12 | Discharge: 2014-12-12 | Disposition: A | Discharge status: Home / Self Care | Payer: Medicare Other | Source: Ambulatory Visit | Attending: Radiation Oncology | Admitting: Radiation Oncology

## 2014-12-12 ENCOUNTER — Inpatient Hospital Stay: Payer: Medicare Other

## 2014-12-12 VITALS — BP 106/68 | HR 68 | Temp 96.4°F | Resp 20 | Wt 158.7 lb

## 2014-12-12 DIAGNOSIS — D649 Anemia, unspecified: Secondary | ICD-10-CM

## 2014-12-12 DIAGNOSIS — M129 Arthropathy, unspecified: Secondary | ICD-10-CM

## 2014-12-12 DIAGNOSIS — C01 Malignant neoplasm of base of tongue: Secondary | ICD-10-CM | POA: Diagnosis not present

## 2014-12-12 DIAGNOSIS — Z85038 Personal history of other malignant neoplasm of large intestine: Secondary | ICD-10-CM

## 2014-12-12 DIAGNOSIS — E785 Hyperlipidemia, unspecified: Secondary | ICD-10-CM

## 2014-12-12 DIAGNOSIS — F329 Major depressive disorder, single episode, unspecified: Secondary | ICD-10-CM

## 2014-12-12 DIAGNOSIS — R944 Abnormal results of kidney function studies: Secondary | ICD-10-CM | POA: Diagnosis not present

## 2014-12-12 DIAGNOSIS — Z87891 Personal history of nicotine dependence: Secondary | ICD-10-CM

## 2014-12-12 DIAGNOSIS — Z510 Encounter for antineoplastic radiation therapy: Secondary | ICD-10-CM | POA: Diagnosis not present

## 2014-12-12 DIAGNOSIS — I959 Hypotension, unspecified: Secondary | ICD-10-CM

## 2014-12-12 DIAGNOSIS — Z5111 Encounter for antineoplastic chemotherapy: Secondary | ICD-10-CM | POA: Diagnosis not present

## 2014-12-12 DIAGNOSIS — I10 Essential (primary) hypertension: Secondary | ICD-10-CM

## 2014-12-12 DIAGNOSIS — Z79899 Other long term (current) drug therapy: Secondary | ICD-10-CM

## 2014-12-12 LAB — CBC WITH DIFFERENTIAL/PLATELET
BASOS ABS: 0.1 10*3/uL (ref 0–0.1)
Basophils Relative: 1 %
EOS ABS: 0.1 10*3/uL (ref 0–0.7)
EOS PCT: 1 %
HCT: 29.7 % — ABNORMAL LOW (ref 40.0–52.0)
HEMOGLOBIN: 9.9 g/dL — AB (ref 13.0–18.0)
Lymphocytes Relative: 25 %
Lymphs Abs: 2 10*3/uL (ref 1.0–3.6)
MCH: 31.9 pg (ref 26.0–34.0)
MCHC: 33.2 g/dL (ref 32.0–36.0)
MCV: 95.9 fL (ref 80.0–100.0)
MONO ABS: 1.6 10*3/uL — AB (ref 0.2–1.0)
MONOS PCT: 19 %
NEUTROS PCT: 54 %
Neutro Abs: 4.3 10*3/uL (ref 1.4–6.5)
Platelets: 485 10*3/uL — ABNORMAL HIGH (ref 150–440)
RBC: 3.1 MIL/uL — ABNORMAL LOW (ref 4.40–5.90)
RDW: 18.9 % — AB (ref 11.5–14.5)
WBC: 8 10*3/uL (ref 3.8–10.6)

## 2014-12-12 LAB — BASIC METABOLIC PANEL
Anion gap: 5 (ref 5–15)
BUN: 37 mg/dL — AB (ref 6–20)
CO2: 27 mmol/L (ref 22–32)
Calcium: 8.7 mg/dL — ABNORMAL LOW (ref 8.9–10.3)
Chloride: 104 mmol/L (ref 101–111)
Creatinine, Ser: 1.53 mg/dL — ABNORMAL HIGH (ref 0.61–1.24)
GFR, EST AFRICAN AMERICAN: 56 mL/min — AB (ref >60)
GFR, EST NON AFRICAN AMERICAN: 48 mL/min — AB (ref >60)
Glucose, Bld: 112 mg/dL — ABNORMAL HIGH (ref 65–99)
POTASSIUM: 4.4 mmol/L (ref 3.5–5.1)
Sodium: 136 mmol/L (ref 135–145)

## 2014-12-12 MED ORDER — PROCHLORPERAZINE MALEATE 10 MG PO TABS: 10.0000 mg | ORAL_TABLET | Freq: Four times a day (QID) | ORAL | Status: DC | PRN

## 2014-12-12 NOTE — Progress Notes (Signed)
Patient here today for follow up regarding restart of chemotherapy, radiation started 12/11/14.

## 2014-12-13 ENCOUNTER — Ambulatory Visit
Admission: RE | Admit: 2014-12-13 | Discharge: 2014-12-13 | Disposition: A | Discharge status: Home / Self Care | Payer: Medicare Other | Source: Ambulatory Visit | Attending: Radiation Oncology | Admitting: Radiation Oncology

## 2014-12-13 DIAGNOSIS — Z510 Encounter for antineoplastic radiation therapy: Secondary | ICD-10-CM | POA: Diagnosis not present

## 2014-12-14 ENCOUNTER — Inpatient Hospital Stay: Payer: Medicare Other

## 2014-12-14 ENCOUNTER — Telehealth: Payer: Self-pay | Admitting: *Deleted

## 2014-12-14 ENCOUNTER — Ambulatory Visit
Admission: RE | Admit: 2014-12-14 | Discharge: 2014-12-14 | Disposition: A | Discharge status: Home / Self Care | Payer: Medicare Other | Source: Ambulatory Visit | Attending: Radiation Oncology | Admitting: Radiation Oncology

## 2014-12-14 DIAGNOSIS — Z5111 Encounter for antineoplastic chemotherapy: Secondary | ICD-10-CM | POA: Diagnosis not present

## 2014-12-14 DIAGNOSIS — C01 Malignant neoplasm of base of tongue: Secondary | ICD-10-CM

## 2014-12-14 DIAGNOSIS — Z510 Encounter for antineoplastic radiation therapy: Secondary | ICD-10-CM | POA: Diagnosis not present

## 2014-12-14 MED ORDER — PALONOSETRON HCL INJECTION 0.25 MG/5ML
0.2500 mg | Freq: Once | INTRAVENOUS | Status: AC
Start: 2014-12-14 — End: 2014-12-14
  Administered 2014-12-14: 0.25 mg via INTRAVENOUS
  Filled 2014-12-14: qty 5

## 2014-12-14 MED ORDER — CALCIUM CARBONATE ANTACID 500 MG PO CHEW
2.0000 | CHEWABLE_TABLET | Freq: Once | ORAL | Status: AC
  Administered 2014-12-14: 400 mg via ORAL
  Filled 2014-12-14: qty 2

## 2014-12-14 MED ORDER — HEPARIN SOD (PORK) LOCK FLUSH 100 UNIT/ML IV SOLN
500.0000 [IU] | Freq: Once | INTRAVENOUS | Status: AC | PRN
  Administered 2014-12-14: 500 [IU]
  Filled 2014-12-14: qty 5

## 2014-12-14 MED ORDER — SODIUM CHLORIDE 0.9 % IV SOLN
Freq: Once | INTRAVENOUS | Status: AC
  Administered 2014-12-14: 10:00:00 via INTRAVENOUS
  Filled 2014-12-14: qty 1000

## 2014-12-14 MED ORDER — SODIUM CHLORIDE 0.9 % IV SOLN
Freq: Once | INTRAVENOUS | Status: AC
  Administered 2014-12-14: 12:00:00 via INTRAVENOUS
  Filled 2014-12-14: qty 5

## 2014-12-14 MED ORDER — POTASSIUM CHLORIDE 2 MEQ/ML IV SOLN
Freq: Once | INTRAVENOUS | Status: AC
  Administered 2014-12-14: 10:00:00 via INTRAVENOUS
  Filled 2014-12-14: qty 1000

## 2014-12-14 MED ORDER — SODIUM CHLORIDE 0.9 % IV SOLN
20.0000 mg/m2 | Freq: Once | INTRAVENOUS | Status: AC
  Administered 2014-12-14: 37 mg via INTRAVENOUS
  Filled 2014-12-14: qty 37

## 2014-12-14 NOTE — Telephone Encounter (Signed)
Spoke with dr Grayland Ormond regarding creatinine 1.53 with limits of 1.5, he is ok with going ahead with tx

## 2014-12-15 ENCOUNTER — Ambulatory Visit
Admission: RE | Admit: 2014-12-15 | Discharge: 2014-12-15 | Disposition: A | Discharge status: Home / Self Care | Payer: Medicare Other | Source: Ambulatory Visit | Attending: Radiation Oncology | Admitting: Radiation Oncology

## 2014-12-15 DIAGNOSIS — Z510 Encounter for antineoplastic radiation therapy: Secondary | ICD-10-CM | POA: Diagnosis not present

## 2014-12-18 NOTE — Progress Notes (Signed)
[place]  Telephone:(336) [phone] Fax:(336) [phone]  ID: Bonnita Levan OB: 1955/11/08  MR#: [id number]  UUV#:[id number]  Patient Care Team: Emelia Loron, MD as PCP - General (Family Medicine)  CHIEF COMPLAINT:  Chief Complaint  Patient presents with  . Follow-up    head and neck cancer    INTERVAL HISTORY:  Patient returns to clinic today for further evaluation and initiation of concurrent chemotherapy and XRT. His appetite has improved. He does not complain of weakness or fatigue today. He denies any fevers. He currently feels well and is asymptomatic. He has no neurologic complaints. He has no chest pain or shortness of breath. He denies any nausea, vomiting, constipation, or diarrhea. He has no urinary complaints. Patient offers no specific complaints today.   REVIEW OF SYSTEMS:   Review of Systems  Constitutional: Positive for malaise/fatigue. Negative for fever and weight loss.  Respiratory: Negative.   Cardiovascular: Negative.     As per HPI. Otherwise, a complete review of systems is negatve.  PAST MEDICAL HISTORY: Past Medical History  Diagnosis Date  . Arthritis   . Cancer     HEAD/NECK/TONGUE CANCER  . Colon cancer   . Depression   . Hyperlipidemia   . HTN (hypertension)   . Squamous cell cancer of tongue     PAST SURGICAL HISTORY: Past Surgical History  Procedure Laterality Date  . Colectomy    . Splenectomy      FAMILY HISTORY Family History  Problem Relation Age of Onset  . Hypercholesterolemia      FAMILY HX  . Head & neck cancer Father        ADVANCED DIRECTIVES:    HEALTH MAINTENANCE: History  Substance Use Topics  . Smoking status: Former Smoker    Types: Cigarettes  . Smokeless tobacco: Not on file     Comment: HEAVY TOBACCO USE. QUIT GREATER THAN 15 YRS AGO  . Alcohol Use: 0.0 oz/week    0 Standard drinks or equivalent per week     Comment: "HEAVY ALCOHOL USE"     Colonoscopy:  PAP:  Bone  density:  Lipid panel:  Allergies  Allergen Reactions  . Penicillin G     Other reaction(s): Itching of Skin, Localized superficial swelling of skin  . Penicillins Itching and Swelling  . Tramadol     Other reaction(s): Unknown    Current Outpatient Prescriptions  Medication Sig Dispense Refill  . amLODipine (NORVASC) 10 MG tablet Take 10 mg by mouth daily.    . cyclobenzaprine (FLEXERIL) 10 MG tablet Take 10 mg by mouth 3 (three) times daily as needed for muscle spasms.    . diazepam (VALIUM) 5 MG tablet Take 5 mg by mouth every 6 (six) hours as needed for anxiety.    . FENOFIBRATE PO Take 1 tablet by mouth 1 day or 1 dose.    . gabapentin (NEURONTIN) 600 MG tablet Take 600 mg by mouth 3 (three) times daily.  3  . hydrochlorothiazide (MICROZIDE) 12.5 MG capsule Take 1 capsule by mouth 1 day or 1 dose.    . ibuprofen (ADVIL,MOTRIN) 800 MG tablet Take 1 tablet by mouth 3 (three) times daily as needed. PAIN    . lisinopril (PRINIVIL,ZESTRIL) 40 MG tablet Take 40 mg by mouth daily.  5  . mirtazapine (REMERON) 15 MG tablet Take 15 mg by mouth at bedtime.    . NON FORMULARY Take 5 mLs by mouth 4 (four) times daily as needed.    [name]  Omega-3 Fatty Acids (FISH OIL) 1000 MG CAPS Take 1 capsule by mouth.    . Oxycodone HCl 10 MG TABS Take 1 tablet (10 mg total) by mouth every 8 (eight) hours as needed. 90 tablet 0  . simvastatin (ZOCOR) 20 MG tablet Take 20 mg by mouth at bedtime.  5  . vitamin C (ASCORBIC ACID) 500 MG tablet Take by mouth 1 day or 1 dose.    . prochlorperazine (COMPAZINE) 10 MG tablet Take 1 tablet (10 mg total) by mouth every 6 (six) hours as needed for nausea or vomiting. 30 tablet 2   No current facility-administered medications for this visit.   Facility-Administered Medications Ordered in Other Visits  Medication Dose Route Frequency Provider Last Rate Last Dose  . sodium chloride 0.9 % injection 10 mL  10 mL Intravenous PRN Lloyd Huger, MD   10 mL at 11/03/14 1005     OBJECTIVE: Filed Vitals:   12/12/14 1045  BP: 106/68  Pulse: 68  Temp: 96.4 F (35.8 C)  Resp: 20     Body mass index is 23.46 kg/(m^2).    ECOG FS:0 - Asymptomatic  General: Well-developed, well-nourished, no acute distress. Eyes: anicteric sclera. HEENT: Decreased lymphadenopathy of left neck. Lungs: Clear to auscultation bilaterally. Heart: Regular rate and rhythm. No rubs, murmurs, or gallops. Abdomen: Soft, nontender, nondistended. No organomegaly noted, normoactive bowel sounds. Musculoskeletal: No edema, cyanosis, or clubbing. Neuro: Alert, answering all questions appropriately. Cranial nerves grossly intact. Skin: No rashes or petechiae noted. Psych: Normal affect.    LAB RESULTS:  Lab Results  Component Value Date   NA 136 12/12/2014   K 4.4 12/12/2014   CL 104 12/12/2014   CO2 27 12/12/2014   GLUCOSE 112* 12/12/2014   BUN 37* 12/12/2014   CREATININE 1.53* 12/12/2014   CALCIUM 8.7* 12/12/2014   PROT 6.7 11/14/2014   ALBUMIN 3.7 11/14/2014   AST 21 11/14/2014   ALT 16* 11/14/2014   ALKPHOS 95 11/14/2014   BILITOT 0.3 11/14/2014   GFRNONAA 48* 12/12/2014   GFRAA 56* 12/12/2014    Lab Results  Component Value Date   WBC 8.0 12/12/2014   NEUTROABS 4.3 12/12/2014   HGB 9.9* 12/12/2014   HCT 29.7* 12/12/2014   MCV 95.9 12/12/2014   PLT 485* 12/12/2014     STUDIES: No results found.  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  PLAN:    1. Tongue cancer: Outside pathology, imaging, and physician notes reviewed independently. Patient has now completed 3 cycles of induction chemotherapy Taxotere, cisplatin, and 5-FU. Patient initiated his XRT this week and will return to clinic in 2 days to initiate cycle 1 of weekly cisplatin 20 mg/m2. Return to clinic in 1 week for consideration of cycle 2.  2. Neutropenic fevers: Resolved.  3. Anemia: Secondary chemotherapy, monitor. 4. Hypotension: Monitor. Patient does not require IV  fluids today. 5. Elevated creatinine: Monitor closely retreatment with cisplatin.   Patient expressed understanding and was in agreement with this plan. He also understands that He can call clinic at any time with any questions, concerns, or complaints.   Malignant neoplasm of base of tongue   Staging form: Lip and Oral Cavity, AJCC 7th Edition     Clinical stage from 10/18/2014: Stage IVA (T2, N2, M0) - Signed by Lloyd Huger, MD on 10/18/2014   Lloyd Huger, MD   12/18/2014 3:22 PM

## 2014-12-19 ENCOUNTER — Ambulatory Visit
Admission: RE | Admit: 2014-12-19 | Discharge: 2014-12-19 | Disposition: A | Discharge status: Home / Self Care | Payer: Medicare Other | Source: Ambulatory Visit | Attending: Radiation Oncology | Admitting: Radiation Oncology

## 2014-12-19 ENCOUNTER — Inpatient Hospital Stay
Admission: RE | Admit: 2014-12-19 | Discharge: 2014-12-19 | Disposition: A | Discharge status: Home / Self Care | Payer: Self-pay | Source: Ambulatory Visit | Attending: Radiation Oncology | Admitting: Radiation Oncology

## 2014-12-19 DIAGNOSIS — Z510 Encounter for antineoplastic radiation therapy: Secondary | ICD-10-CM | POA: Diagnosis not present

## 2014-12-20 ENCOUNTER — Ambulatory Visit
Admission: RE | Admit: 2014-12-20 | Discharge: 2014-12-20 | Disposition: A | Discharge status: Home / Self Care | Payer: Medicare Other | Source: Ambulatory Visit | Attending: Radiation Oncology | Admitting: Radiation Oncology

## 2014-12-20 DIAGNOSIS — Z510 Encounter for antineoplastic radiation therapy: Secondary | ICD-10-CM | POA: Diagnosis not present

## 2014-12-21 ENCOUNTER — Inpatient Hospital Stay: Payer: Medicare Other | Attending: Oncology

## 2014-12-21 ENCOUNTER — Inpatient Hospital Stay (HOSPITAL_BASED_OUTPATIENT_CLINIC_OR_DEPARTMENT_OTHER): Payer: Medicare Other | Admitting: Oncology

## 2014-12-21 ENCOUNTER — Encounter: Payer: Self-pay | Admitting: Oncology

## 2014-12-21 ENCOUNTER — Ambulatory Visit
Admission: RE | Admit: 2014-12-21 | Discharge: 2014-12-21 | Disposition: A | Discharge status: Home / Self Care | Payer: Medicare Other | Source: Ambulatory Visit | Attending: Radiation Oncology | Admitting: Radiation Oncology

## 2014-12-21 ENCOUNTER — Inpatient Hospital Stay: Payer: Medicare Other

## 2014-12-21 VITALS — BP 111/72 | HR 75 | Temp 97.0°F | Resp 22 | Wt 154.5 lb

## 2014-12-21 DIAGNOSIS — F329 Major depressive disorder, single episode, unspecified: Secondary | ICD-10-CM | POA: Insufficient documentation

## 2014-12-21 DIAGNOSIS — Z87891 Personal history of nicotine dependence: Secondary | ICD-10-CM | POA: Insufficient documentation

## 2014-12-21 DIAGNOSIS — C01 Malignant neoplasm of base of tongue: Secondary | ICD-10-CM | POA: Insufficient documentation

## 2014-12-21 DIAGNOSIS — R070 Pain in throat: Secondary | ICD-10-CM | POA: Insufficient documentation

## 2014-12-21 DIAGNOSIS — M1990 Unspecified osteoarthritis, unspecified site: Secondary | ICD-10-CM | POA: Insufficient documentation

## 2014-12-21 DIAGNOSIS — Z5111 Encounter for antineoplastic chemotherapy: Secondary | ICD-10-CM | POA: Insufficient documentation

## 2014-12-21 DIAGNOSIS — T451X5S Adverse effect of antineoplastic and immunosuppressive drugs, sequela: Secondary | ICD-10-CM

## 2014-12-21 DIAGNOSIS — IMO0002 Reserved for concepts with insufficient information to code with codable children: Secondary | ICD-10-CM

## 2014-12-21 DIAGNOSIS — Z79899 Other long term (current) drug therapy: Secondary | ICD-10-CM

## 2014-12-21 DIAGNOSIS — I959 Hypotension, unspecified: Secondary | ICD-10-CM

## 2014-12-21 DIAGNOSIS — D6481 Anemia due to antineoplastic chemotherapy: Secondary | ICD-10-CM | POA: Diagnosis not present

## 2014-12-21 DIAGNOSIS — E785 Hyperlipidemia, unspecified: Secondary | ICD-10-CM | POA: Diagnosis not present

## 2014-12-21 DIAGNOSIS — I10 Essential (primary) hypertension: Secondary | ICD-10-CM | POA: Insufficient documentation

## 2014-12-21 DIAGNOSIS — R944 Abnormal results of kidney function studies: Secondary | ICD-10-CM | POA: Insufficient documentation

## 2014-12-21 DIAGNOSIS — Z510 Encounter for antineoplastic radiation therapy: Secondary | ICD-10-CM | POA: Diagnosis not present

## 2014-12-21 DIAGNOSIS — Z85038 Personal history of other malignant neoplasm of large intestine: Secondary | ICD-10-CM

## 2014-12-21 LAB — COMPREHENSIVE METABOLIC PANEL
ALBUMIN: 4 g/dL (ref 3.5–5.0)
ALT: 13 U/L — ABNORMAL LOW (ref 17–63)
AST: 17 U/L (ref 15–41)
Alkaline Phosphatase: 88 U/L (ref 38–126)
Anion gap: 6 (ref 5–15)
BUN: 35 mg/dL — ABNORMAL HIGH (ref 6–20)
CHLORIDE: 103 mmol/L (ref 101–111)
CO2: 25 mmol/L (ref 22–32)
CREATININE: 1.16 mg/dL (ref 0.61–1.24)
Calcium: 8.8 mg/dL — ABNORMAL LOW (ref 8.9–10.3)
GFR calc Af Amer: >60 mL/min (ref >60)
GFR calc non Af Amer: >60 mL/min (ref >60)
Glucose, Bld: 96 mg/dL (ref 65–99)
Potassium: 4 mmol/L (ref 3.5–5.1)
SODIUM: 134 mmol/L — AB (ref 135–145)
Total Bilirubin: 0.4 mg/dL (ref 0.3–1.2)
Total Protein: 7.2 g/dL (ref 6.5–8.1)

## 2014-12-21 LAB — CBC WITH DIFFERENTIAL/PLATELET
Basophils Absolute: 0.1 10*3/uL (ref 0–0.1)
Basophils Relative: 1 %
Eosinophils Absolute: 0.1 10*3/uL (ref 0–0.7)
Eosinophils Relative: 2 %
HCT: 32.9 % — ABNORMAL LOW (ref 40.0–52.0)
HEMOGLOBIN: 11 g/dL — AB (ref 13.0–18.0)
LYMPHS PCT: 22 %
Lymphs Abs: 2.1 10*3/uL (ref 1.0–3.6)
MCH: 31.9 pg (ref 26.0–34.0)
MCHC: 33.3 g/dL (ref 32.0–36.0)
MCV: 95.8 fL (ref 80.0–100.0)
MONOS PCT: 15 %
Monocytes Absolute: 1.4 10*3/uL — ABNORMAL HIGH (ref 0.2–1.0)
NEUTROS ABS: 5.6 10*3/uL (ref 1.4–6.5)
Neutrophils Relative %: 60 %
Platelets: 480 10*3/uL — ABNORMAL HIGH (ref 150–440)
RBC: 3.44 MIL/uL — AB (ref 4.40–5.90)
RDW: 19.1 % — ABNORMAL HIGH (ref 11.5–14.5)
WBC: 9.2 10*3/uL (ref 3.8–10.6)

## 2014-12-21 MED ORDER — POTASSIUM CHLORIDE 2 MEQ/ML IV SOLN
Freq: Once | INTRAVENOUS | Status: AC
  Administered 2014-12-21: 11:00:00 via INTRAVENOUS
  Filled 2014-12-21: qty 1000

## 2014-12-21 MED ORDER — HEPARIN SOD (PORK) LOCK FLUSH 100 UNIT/ML IV SOLN
500.0000 [IU] | Freq: Once | INTRAVENOUS | Status: AC | PRN
  Administered 2014-12-21: 500 [IU]
  Filled 2014-12-21: qty 5

## 2014-12-21 MED ORDER — SODIUM CHLORIDE 0.9 % IV SOLN
Freq: Once | INTRAVENOUS | Status: AC
  Administered 2014-12-21: 11:00:00 via INTRAVENOUS
  Filled 2014-12-21: qty 1000

## 2014-12-21 MED ORDER — SODIUM CHLORIDE 0.9 % IV SOLN
Freq: Once | INTRAVENOUS | Status: AC
  Administered 2014-12-21: 13:00:00 via INTRAVENOUS
  Filled 2014-12-21: qty 5

## 2014-12-21 MED ORDER — PALONOSETRON HCL INJECTION 0.25 MG/5ML
0.2500 mg | Freq: Once | INTRAVENOUS | Status: AC
Start: 2014-12-21 — End: 2014-12-21
  Administered 2014-12-21: 0.25 mg via INTRAVENOUS
  Filled 2014-12-21: qty 5

## 2014-12-21 MED ORDER — SODIUM CHLORIDE 0.9 % IV SOLN
20.0000 mg/m2 | Freq: Once | INTRAVENOUS | Status: AC
  Administered 2014-12-21: 37 mg via INTRAVENOUS
  Filled 2014-12-21: qty 37

## 2014-12-22 ENCOUNTER — Other Ambulatory Visit: Payer: Self-pay | Admitting: *Deleted

## 2014-12-22 ENCOUNTER — Ambulatory Visit
Admission: RE | Admit: 2014-12-22 | Discharge: 2014-12-22 | Disposition: A | Discharge status: Home / Self Care | Payer: Medicare Other | Source: Ambulatory Visit | Attending: Radiation Oncology | Admitting: Radiation Oncology

## 2014-12-22 DIAGNOSIS — C01 Malignant neoplasm of base of tongue: Secondary | ICD-10-CM

## 2014-12-22 DIAGNOSIS — Z510 Encounter for antineoplastic radiation therapy: Secondary | ICD-10-CM | POA: Diagnosis not present

## 2014-12-22 MED ORDER — SUCRALFATE 1 G PO TABS: 1.0000 g | ORAL_TABLET | Freq: Three times a day (TID) | ORAL | Status: DC

## 2014-12-25 ENCOUNTER — Ambulatory Visit
Admission: RE | Admit: 2014-12-25 | Discharge: 2014-12-25 | Disposition: A | Discharge status: Home / Self Care | Payer: Medicare Other | Source: Ambulatory Visit | Attending: Radiation Oncology | Admitting: Radiation Oncology

## 2014-12-25 DIAGNOSIS — Z510 Encounter for antineoplastic radiation therapy: Secondary | ICD-10-CM | POA: Diagnosis not present

## 2014-12-26 ENCOUNTER — Ambulatory Visit
Admission: RE | Admit: 2014-12-26 | Discharge: 2014-12-26 | Disposition: A | Discharge status: Home / Self Care | Payer: Medicare Other | Source: Ambulatory Visit | Attending: Radiation Oncology | Admitting: Radiation Oncology

## 2014-12-26 DIAGNOSIS — Z510 Encounter for antineoplastic radiation therapy: Secondary | ICD-10-CM | POA: Diagnosis not present

## 2014-12-27 ENCOUNTER — Ambulatory Visit
Admission: RE | Admit: 2014-12-27 | Discharge: 2014-12-27 | Disposition: A | Discharge status: Home / Self Care | Payer: Medicare Other | Source: Ambulatory Visit | Attending: Radiation Oncology | Admitting: Radiation Oncology

## 2014-12-27 DIAGNOSIS — Z510 Encounter for antineoplastic radiation therapy: Secondary | ICD-10-CM | POA: Diagnosis not present

## 2014-12-28 ENCOUNTER — Inpatient Hospital Stay: Payer: Medicare Other

## 2014-12-28 ENCOUNTER — Other Ambulatory Visit: Payer: Medicare Other

## 2014-12-28 ENCOUNTER — Inpatient Hospital Stay (HOSPITAL_BASED_OUTPATIENT_CLINIC_OR_DEPARTMENT_OTHER): Payer: Medicare Other | Admitting: Oncology

## 2014-12-28 ENCOUNTER — Ambulatory Visit
Admission: RE | Admit: 2014-12-28 | Discharge: 2014-12-28 | Disposition: A | Discharge status: Home / Self Care | Payer: Medicare Other | Source: Ambulatory Visit | Attending: Radiation Oncology | Admitting: Radiation Oncology

## 2014-12-28 VITALS — BP 111/73 | HR 69 | Temp 95.4°F | Resp 16 | Wt 158.1 lb

## 2014-12-28 VITALS — BP 115/75 | HR 70 | Temp 95.6°F | Resp 18

## 2014-12-28 DIAGNOSIS — C01 Malignant neoplasm of base of tongue: Secondary | ICD-10-CM | POA: Diagnosis not present

## 2014-12-28 DIAGNOSIS — Z5111 Encounter for antineoplastic chemotherapy: Secondary | ICD-10-CM | POA: Diagnosis not present

## 2014-12-28 DIAGNOSIS — Z510 Encounter for antineoplastic radiation therapy: Secondary | ICD-10-CM | POA: Diagnosis not present

## 2014-12-28 LAB — BASIC METABOLIC PANEL
Anion gap: 5 (ref 5–15)
BUN: 42 mg/dL — AB (ref 6–20)
CO2: 28 mmol/L (ref 22–32)
CREATININE: 1.22 mg/dL (ref 0.61–1.24)
Calcium: 9.4 mg/dL (ref 8.9–10.3)
Chloride: 103 mmol/L (ref 101–111)
GLUCOSE: 101 mg/dL — AB (ref 65–99)
Potassium: 3.9 mmol/L (ref 3.5–5.1)
Sodium: 136 mmol/L (ref 135–145)

## 2014-12-28 LAB — CBC WITH DIFFERENTIAL/PLATELET
Basophils Absolute: 0.1 10*3/uL (ref 0–0.1)
Basophils Relative: 1 %
EOS ABS: 0.2 10*3/uL (ref 0–0.7)
Eosinophils Relative: 2 %
HEMATOCRIT: 32.4 % — AB (ref 40.0–52.0)
Hemoglobin: 10.8 g/dL — ABNORMAL LOW (ref 13.0–18.0)
LYMPHS ABS: 1.7 10*3/uL (ref 1.0–3.6)
LYMPHS PCT: 19 %
MCH: 32.2 pg (ref 26.0–34.0)
MCHC: 33.4 g/dL (ref 32.0–36.0)
MCV: 96.5 fL (ref 80.0–100.0)
MONO ABS: 1.3 10*3/uL — AB (ref 0.2–1.0)
MONOS PCT: 15 %
Neutro Abs: 5.6 10*3/uL (ref 1.4–6.5)
Neutrophils Relative %: 63 %
Platelets: 231 10*3/uL (ref 150–440)
RBC: 3.36 MIL/uL — ABNORMAL LOW (ref 4.40–5.90)
RDW: 19.2 % — ABNORMAL HIGH (ref 11.5–14.5)
WBC: 8.9 10*3/uL (ref 3.8–10.6)

## 2014-12-28 MED ORDER — SODIUM CHLORIDE 0.9 % IV SOLN
20.0000 mg/m2 | Freq: Once | INTRAVENOUS | Status: AC
  Administered 2014-12-28: 37 mg via INTRAVENOUS
  Filled 2014-12-28: qty 37

## 2014-12-28 MED ORDER — SODIUM CHLORIDE 0.9 % IJ SOLN
10.0000 mL | INTRAMUSCULAR | Status: DC | PRN
  Administered 2014-12-28: 10 mL via INTRAVENOUS
  Filled 2014-12-28: qty 10

## 2014-12-28 MED ORDER — HEPARIN SOD (PORK) LOCK FLUSH 100 UNIT/ML IV SOLN
500.0000 [IU] | Freq: Once | INTRAVENOUS | Status: AC
  Administered 2014-12-28: 500 [IU] via INTRAVENOUS
  Filled 2014-12-28: qty 5

## 2014-12-28 MED ORDER — SODIUM CHLORIDE 0.9 % IV SOLN
Freq: Once | INTRAVENOUS | Status: AC
  Administered 2014-12-28: 11:00:00 via INTRAVENOUS
  Filled 2014-12-28: qty 1000

## 2014-12-28 MED ORDER — SODIUM CHLORIDE 0.9 % IV SOLN
Freq: Once | INTRAVENOUS | Status: AC
  Administered 2014-12-28: 13:00:00 via INTRAVENOUS
  Filled 2014-12-28: qty 5

## 2014-12-28 MED ORDER — POTASSIUM CHLORIDE 2 MEQ/ML IV SOLN
Freq: Once | INTRAVENOUS | Status: AC
  Administered 2014-12-28: 11:00:00 via INTRAVENOUS
  Filled 2014-12-28: qty 1000

## 2014-12-28 MED ORDER — PALONOSETRON HCL INJECTION 0.25 MG/5ML
0.2500 mg | Freq: Once | INTRAVENOUS | Status: AC
  Administered 2014-12-28: 0.25 mg via INTRAVENOUS
  Filled 2014-12-28: qty 5

## 2014-12-28 NOTE — Progress Notes (Signed)
Patient reports the Carafate has really helped with his swallowing and his appetite is doing good.

## 2014-12-29 ENCOUNTER — Ambulatory Visit
Admission: RE | Admit: 2014-12-29 | Discharge: 2014-12-29 | Disposition: A | Discharge status: Home / Self Care | Payer: Medicare Other | Source: Ambulatory Visit | Attending: Radiation Oncology | Admitting: Radiation Oncology

## 2014-12-29 DIAGNOSIS — Z510 Encounter for antineoplastic radiation therapy: Secondary | ICD-10-CM | POA: Diagnosis not present

## 2015-01-01 ENCOUNTER — Ambulatory Visit
Admission: RE | Admit: 2015-01-01 | Discharge: 2015-01-01 | Disposition: A | Discharge status: Home / Self Care | Payer: Medicare Other | Source: Ambulatory Visit | Attending: Radiation Oncology | Admitting: Radiation Oncology

## 2015-01-01 DIAGNOSIS — Z510 Encounter for antineoplastic radiation therapy: Secondary | ICD-10-CM | POA: Diagnosis not present

## 2015-01-02 ENCOUNTER — Ambulatory Visit
Admission: RE | Admit: 2015-01-02 | Discharge: 2015-01-02 | Disposition: A | Discharge status: Home / Self Care | Payer: Medicare Other | Source: Ambulatory Visit | Attending: Radiation Oncology | Admitting: Radiation Oncology

## 2015-01-02 DIAGNOSIS — Z510 Encounter for antineoplastic radiation therapy: Secondary | ICD-10-CM | POA: Diagnosis not present

## 2015-01-03 ENCOUNTER — Ambulatory Visit
Admission: RE | Admit: 2015-01-03 | Discharge: 2015-01-03 | Disposition: A | Discharge status: Home / Self Care | Payer: Medicare Other | Source: Ambulatory Visit | Attending: Radiation Oncology | Admitting: Radiation Oncology

## 2015-01-03 DIAGNOSIS — Z510 Encounter for antineoplastic radiation therapy: Secondary | ICD-10-CM | POA: Diagnosis not present

## 2015-01-04 ENCOUNTER — Inpatient Hospital Stay: Payer: Medicare Other

## 2015-01-04 ENCOUNTER — Ambulatory Visit: Admission: RE | Admit: 2015-01-04 | Payer: Medicare Other | Source: Ambulatory Visit

## 2015-01-04 ENCOUNTER — Encounter: Payer: Self-pay | Admitting: Oncology

## 2015-01-04 ENCOUNTER — Inpatient Hospital Stay (HOSPITAL_BASED_OUTPATIENT_CLINIC_OR_DEPARTMENT_OTHER): Payer: Medicare Other | Admitting: Oncology

## 2015-01-04 VITALS — BP 103/68 | HR 61 | Temp 96.0°F | Resp 18 | Wt 163.1 lb

## 2015-01-04 DIAGNOSIS — C01 Malignant neoplasm of base of tongue: Secondary | ICD-10-CM | POA: Diagnosis not present

## 2015-01-04 DIAGNOSIS — I959 Hypotension, unspecified: Secondary | ICD-10-CM | POA: Diagnosis not present

## 2015-01-04 DIAGNOSIS — T451X5S Adverse effect of antineoplastic and immunosuppressive drugs, sequela: Secondary | ICD-10-CM | POA: Diagnosis not present

## 2015-01-04 DIAGNOSIS — E785 Hyperlipidemia, unspecified: Secondary | ICD-10-CM

## 2015-01-04 DIAGNOSIS — R944 Abnormal results of kidney function studies: Secondary | ICD-10-CM

## 2015-01-04 DIAGNOSIS — R070 Pain in throat: Secondary | ICD-10-CM

## 2015-01-04 DIAGNOSIS — D6481 Anemia due to antineoplastic chemotherapy: Secondary | ICD-10-CM

## 2015-01-04 DIAGNOSIS — I10 Essential (primary) hypertension: Secondary | ICD-10-CM

## 2015-01-04 DIAGNOSIS — F329 Major depressive disorder, single episode, unspecified: Secondary | ICD-10-CM

## 2015-01-04 DIAGNOSIS — Z79899 Other long term (current) drug therapy: Secondary | ICD-10-CM

## 2015-01-04 DIAGNOSIS — M1990 Unspecified osteoarthritis, unspecified site: Secondary | ICD-10-CM

## 2015-01-04 DIAGNOSIS — Z5111 Encounter for antineoplastic chemotherapy: Secondary | ICD-10-CM | POA: Diagnosis not present

## 2015-01-04 DIAGNOSIS — Z85038 Personal history of other malignant neoplasm of large intestine: Secondary | ICD-10-CM

## 2015-01-04 LAB — CBC WITH DIFFERENTIAL/PLATELET
BASOS ABS: 0.1 10*3/uL (ref 0–0.1)
Basophils Relative: 1 %
Eosinophils Absolute: 0.2 10*3/uL (ref 0–0.7)
Eosinophils Relative: 3 %
HEMATOCRIT: 31 % — AB (ref 40.0–52.0)
Hemoglobin: 10.3 g/dL — ABNORMAL LOW (ref 13.0–18.0)
Lymphocytes Relative: 17 %
Lymphs Abs: 1.2 10*3/uL (ref 1.0–3.6)
MCH: 32.2 pg (ref 26.0–34.0)
MCHC: 33.1 g/dL (ref 32.0–36.0)
MCV: 97.4 fL (ref 80.0–100.0)
Monocytes Absolute: 1 10*3/uL (ref 0.2–1.0)
Monocytes Relative: 15 %
NEUTROS PCT: 64 %
Neutro Abs: 4.5 10*3/uL (ref 1.4–6.5)
Platelets: 219 10*3/uL (ref 150–440)
RBC: 3.19 MIL/uL — ABNORMAL LOW (ref 4.40–5.90)
RDW: 19.9 % — ABNORMAL HIGH (ref 11.5–14.5)
WBC: 7.1 10*3/uL (ref 3.8–10.6)

## 2015-01-04 LAB — BASIC METABOLIC PANEL
Anion gap: 6 (ref 5–15)
BUN: 31 mg/dL — ABNORMAL HIGH (ref 6–20)
CHLORIDE: 99 mmol/L — AB (ref 101–111)
CO2: 27 mmol/L (ref 22–32)
Calcium: 8.7 mg/dL — ABNORMAL LOW (ref 8.9–10.3)
Creatinine, Ser: 1.11 mg/dL (ref 0.61–1.24)
GFR calc Af Amer: >60 mL/min (ref >60)
GFR calc non Af Amer: >60 mL/min (ref >60)
Glucose, Bld: 112 mg/dL — ABNORMAL HIGH (ref 65–99)
POTASSIUM: 4 mmol/L (ref 3.5–5.1)
SODIUM: 132 mmol/L — AB (ref 135–145)

## 2015-01-04 MED ORDER — SODIUM CHLORIDE 0.9 % IV SOLN
Freq: Once | INTRAVENOUS | Status: AC
  Administered 2015-01-04: 13:00:00 via INTRAVENOUS
  Filled 2015-01-04: qty 5

## 2015-01-04 MED ORDER — PALONOSETRON HCL INJECTION 0.25 MG/5ML
0.2500 mg | Freq: Once | INTRAVENOUS | Status: AC
  Administered 2015-01-04: 0.25 mg via INTRAVENOUS
  Filled 2015-01-04: qty 5

## 2015-01-04 MED ORDER — POTASSIUM CHLORIDE 2 MEQ/ML IV SOLN
Freq: Once | INTRAVENOUS | Status: AC
  Administered 2015-01-04: 11:00:00 via INTRAVENOUS
  Filled 2015-01-04: qty 1000

## 2015-01-04 MED ORDER — HEPARIN SOD (PORK) LOCK FLUSH 100 UNIT/ML IV SOLN
500.0000 [IU] | Freq: Once | INTRAVENOUS | Status: AC
  Administered 2015-01-04: 500 [IU] via INTRAVENOUS
  Filled 2015-01-04: qty 5

## 2015-01-04 MED ORDER — SODIUM CHLORIDE 0.9 % IV SOLN
Freq: Once | INTRAVENOUS | Status: AC
  Administered 2015-01-04: 11:00:00 via INTRAVENOUS
  Filled 2015-01-04: qty 1000

## 2015-01-04 MED ORDER — SODIUM CHLORIDE 0.9 % IJ SOLN
10.0000 mL | INTRAMUSCULAR | Status: DC | PRN
  Filled 2015-01-04: qty 10

## 2015-01-04 MED ORDER — SODIUM CHLORIDE 0.9 % IV SOLN
20.0000 mg/m2 | Freq: Once | INTRAVENOUS | Status: AC
  Administered 2015-01-04: 37 mg via INTRAVENOUS
  Filled 2015-01-04: qty 37

## 2015-01-05 ENCOUNTER — Ambulatory Visit: Payer: Medicare Other

## 2015-01-05 NOTE — Progress Notes (Signed)
Mathew [facility]  Telephone:(336) [phone] Fax:(336) [phone]  ID: Mathew Robinson OB: 07-18-1955  MR#: [id number]  DUK#:[id number]  Patient Care Team: Emelia Loron, MD as PCP - General (Family Medicine)  CHIEF COMPLAINT:  Chief Complaint  Patient presents with  . Cancer  . Follow-up    INTERVAL HISTORY:  Patient returns to clinic today for further evaluation and consideration of cycle 2 of weekly cisplatin. He is tolerating his chemotherapy and XRT well without significant side effects. He does not complain of dysphagia. His appetite has improved. He does not complain of weakness or fatigue today. He denies any fevers. He currently feels well and is asymptomatic. He has no neurologic complaints. He has no chest pain or shortness of breath. He denies any nausea, vomiting, constipation, or diarrhea. He has no urinary complaints. Patient offers no specific complaints today.   REVIEW OF SYSTEMS:   Review of Systems  Constitutional: Negative for fever, weight loss and malaise/fatigue.  Respiratory: Negative.   Cardiovascular: Negative.     As per HPI. Otherwise, a complete review of systems is negatve.  PAST MEDICAL HISTORY: Past Medical History  Diagnosis Date  . Arthritis   . Cancer     HEAD/NECK/TONGUE CANCER  . Colon cancer   . Depression   . Hyperlipidemia   . HTN (hypertension)   . Squamous cell cancer of tongue     PAST SURGICAL HISTORY: Past Surgical History  Procedure Laterality Date  . Colectomy    . Splenectomy      FAMILY HISTORY Family History  Problem Relation Age of Onset  . Hypercholesterolemia      FAMILY HX  . Head & neck cancer Father        ADVANCED DIRECTIVES:    HEALTH MAINTENANCE: History  Substance Use Topics  . Smoking status: Former Smoker -- 1.50 packs/day for 20 years    Types: Cigarettes  . Smokeless tobacco: Former Systems developer    Quit date: 03/06/1985     Comment: HEAVY TOBACCO USE. QUIT GREATER THAN 15 YRS AGO  .  Alcohol Use: 0.0 oz/week    0 Standard drinks or equivalent per week     Comment: Patient states "very little     Colonoscopy:  PAP:  Bone density:  Lipid panel:  Allergies  Allergen Reactions  . Penicillin G     Other reaction(s): Itching of Skin, Localized superficial swelling of skin  . Penicillins Itching and Swelling  . Tramadol     Other reaction(s): Unknown    Current Outpatient Prescriptions  Medication Sig Dispense Refill  . amLODipine (NORVASC) 10 MG tablet Take 10 mg by mouth daily.    [name] atenolol-chlorthalidone (TENORETIC) 50-25 MG per tablet Take 1 tablet by mouth daily.  5  . cyclobenzaprine (FLEXERIL) 10 MG tablet Take 10 mg by mouth 3 (three) times daily as needed for muscle spasms.    . diazepam (VALIUM) 10 MG tablet Take 10 mg by mouth at bedtime.  0  . FENOFIBRATE PO Take 1 tablet by mouth 1 day or 1 dose.    . gabapentin (NEURONTIN) 600 MG tablet Take 600 mg by mouth 3 (three) times daily.  3  . hydrochlorothiazide (MICROZIDE) 12.5 MG capsule Take 1 capsule by mouth 1 day or 1 dose.    . ibuprofen (ADVIL,MOTRIN) 800 MG tablet Take 1 tablet by mouth 3 (three) times daily as needed. PAIN    . lisinopril (PRINIVIL,ZESTRIL) 40 MG tablet Take 40 mg by mouth daily.  5  .  mirtazapine (REMERON) 15 MG tablet Take 15 mg by mouth at bedtime.    . NON FORMULARY Take 5 mLs by mouth 4 (four) times daily as needed.    . Omega-3 Fatty Acids (FISH OIL) 1000 MG CAPS Take 1 capsule by mouth.    . Oxycodone HCl 10 MG TABS Take 1 tablet (10 mg total) by mouth every 8 (eight) hours as needed. 90 tablet 0  . prochlorperazine (COMPAZINE) 10 MG tablet Take 1 tablet (10 mg total) by mouth every 6 (six) hours as needed for nausea or vomiting. 30 tablet 2  . simvastatin (ZOCOR) 20 MG tablet Take 20 mg by mouth at bedtime.  5  . sucralfate (CARAFATE) 1 G tablet Take 1 tablet (1 g total) by mouth 3 (three) times daily. Dissolve in 2-3 tbsp warm water, swish and swallow. 90 tablet 3  .  vitamin C (ASCORBIC ACID) 500 MG tablet Take by mouth 1 day or 1 dose.     No current facility-administered medications for this visit.   Facility-Administered Medications Ordered in Other Visits  Medication Dose Route Frequency Provider Last Rate Last Dose  . sodium chloride 0.9 % injection 10 mL  10 mL Intravenous PRN Lloyd Huger, MD   10 mL at 11/03/14 1005    OBJECTIVE: Filed Vitals:   12/21/14 0954  BP: 111/72  Pulse: 75  Temp: 97 F (36.1 C)  Resp: 22     Body mass index is 22.84 kg/(m^2).    ECOG FS:0 - Asymptomatic  General: Well-developed, well-nourished, no acute distress. Eyes: anicteric sclera. HEENT: No palpable lymphadenopathy of left neck. Lungs: Clear to auscultation bilaterally. Heart: Regular rate and rhythm. No rubs, murmurs, or gallops. Abdomen: Soft, nontender, nondistended. No organomegaly noted, normoactive bowel sounds. Musculoskeletal: No edema, cyanosis, or clubbing. Neuro: Alert, answering all questions appropriately. Cranial nerves grossly intact. Skin: No rashes or petechiae noted. Psych: Normal affect.    LAB RESULTS:  Lab Results  Component Value Date   NA 132* 01/04/2015   K 4.0 01/04/2015   CL 99* 01/04/2015   CO2 27 01/04/2015   GLUCOSE 112* 01/04/2015   BUN 31* 01/04/2015   CREATININE 1.11 01/04/2015   CALCIUM 8.7* 01/04/2015   PROT 7.2 12/21/2014   ALBUMIN 4.0 12/21/2014   AST 17 12/21/2014   ALT 13* 12/21/2014   ALKPHOS 88 12/21/2014   BILITOT 0.4 12/21/2014   GFRNONAA >60 01/04/2015   GFRAA >60 01/04/2015    Lab Results  Component Value Date   WBC 7.1 01/04/2015   NEUTROABS 4.5 01/04/2015   HGB 10.3* 01/04/2015   HCT 31.0* 01/04/2015   MCV 97.4 01/04/2015   PLT 219 01/04/2015     STUDIES: No results found.  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  PLAN:    1. Tongue cancer: Outside pathology, imaging, and physician notes reviewed independently. Patient has now completed 3  cycles of induction chemotherapy Taxotere, cisplatin, and 5-FU. Proceed with cycle 2 of weekly cisplatin 20 mg/m2. Continue daily XRT. Return to clinic in 1 week for consideration of cycle 3.  2. Anemia: Secondary chemotherapy, monitor. 4. Hypotension: Monitor. Patient does not require IV fluids today. 5. Elevated creatinine: Monitor closely retreatment with cisplatin.   Patient expressed understanding and was in agreement with this plan. He also understands that He can call clinic at any time with any questions, concerns, or complaints.   Malignant neoplasm of base of tongue   Staging form: Lip and Oral Cavity,  AJCC 7th Edition     Clinical stage from 10/18/2014: Stage IVA (T2, N2, M0) - Signed by Lloyd Huger, MD on 10/18/2014   Lloyd Huger, MD   01/05/2015 4:52 PM

## 2015-01-05 NOTE — Progress Notes (Signed)
[place]  [phone] [phone]) [phone]  ID: Mathew Robinson OB: 12-Sep-1955  MR#: [id number]  OYD#:[id number]  Patient Care Team: Emelia Loron, MD as PCP - General (Family Medicine)  CHIEF COMPLAINT:  Chief Complaint  Patient presents with  . Follow-up    squamous cell carcinoma    INTERVAL HISTORY:  Patient returns to clinic today for further evaluation and consideration of cycle 3 of weekly cisplatin. He is tolerating his chemotherapy and XRT well without significant side effects. He does not complain of dysphagia. He denies any weakness or fatigue today. He denies any fevers. He currently feels well and is asymptomatic. He has no neurologic complaints. He has no chest pain or shortness of breath. He denies any nausea, vomiting, constipation, or diarrhea. He has no urinary complaints. Patient offers no specific complaints today.   REVIEW OF SYSTEMS:   Review of Systems  Constitutional: Negative for fever, weight loss and malaise/fatigue.  Respiratory: Negative.   Cardiovascular: Negative.     As per HPI. Otherwise, a complete review of systems is negatve.  PAST MEDICAL HISTORY: Past Medical History  Diagnosis Date  . Arthritis   . Cancer     HEAD/NECK/TONGUE CANCER  . Colon cancer   . Depression   . Hyperlipidemia   . HTN (hypertension)   . Squamous cell cancer of tongue     PAST SURGICAL HISTORY: Past Surgical History  Procedure Laterality Date  . Colectomy    . Splenectomy      FAMILY HISTORY Family History  Problem Relation Age of Onset  . Hypercholesterolemia      FAMILY HX  . Head & neck cancer Father        ADVANCED DIRECTIVES:    HEALTH MAINTENANCE: History  Substance Use Topics  . Smoking status: Former Smoker -- 1.50 packs/day for 20 years    Types: Cigarettes  . Smokeless tobacco: Former Systems developer    Quit date: 03/06/1985     Comment: HEAVY TOBACCO USE. QUIT GREATER THAN 15 YRS AGO  . Alcohol Use: 0.0  oz/week    0 Standard drinks or equivalent per week     Comment: Patient states "very little     Colonoscopy:  PAP:  Bone density:  Lipid panel:  Allergies  Allergen Reactions  . Penicillin G     Other reaction(s): Itching of Skin, Localized superficial swelling of skin  . Penicillins Itching and Swelling  . Tramadol     Other reaction(s): Unknown    Current Outpatient Prescriptions  Medication Sig Dispense Refill  . amLODipine (NORVASC) 10 MG tablet Take 10 mg by mouth daily.    [name] atenolol-chlorthalidone (TENORETIC) 50-25 MG per tablet Take 1 tablet by mouth daily.  5  . cyclobenzaprine (FLEXERIL) 10 MG tablet Take 10 mg by mouth 3 (three) times daily as needed for muscle spasms.    . diazepam (VALIUM) 10 MG tablet Take 10 mg by mouth at bedtime.  0  . FENOFIBRATE PO Take 1 tablet by mouth 1 day or 1 dose.    . gabapentin (NEURONTIN) 600 MG tablet Take 600 mg by mouth 3 (three) times daily.  3  . hydrochlorothiazide (MICROZIDE) 12.5 MG capsule Take 1 capsule by mouth 1 day or 1 dose.    . ibuprofen (ADVIL,MOTRIN) 800 MG tablet Take 1 tablet by mouth 3 (three) times daily as needed. PAIN    . lisinopril (PRINIVIL,ZESTRIL) 40 MG tablet Take 40 mg by mouth daily.  5  . mirtazapine (  REMERON) 15 MG tablet Take 15 mg by mouth at bedtime.    . NON FORMULARY Take 5 mLs by mouth 4 (four) times daily as needed.    . Omega-3 Fatty Acids (FISH OIL) 1000 MG CAPS Take 1 capsule by mouth.    . Oxycodone HCl 10 MG TABS Take 1 tablet (10 mg total) by mouth every 8 (eight) hours as needed. 90 tablet 0  . prochlorperazine (COMPAZINE) 10 MG tablet Take 1 tablet (10 mg total) by mouth every 6 (six) hours as needed for nausea or vomiting. 30 tablet 2  . simvastatin (ZOCOR) 20 MG tablet Take 20 mg by mouth at bedtime.  5  . sucralfate (CARAFATE) 1 G tablet Take 1 tablet (1 g total) by mouth 3 (three) times daily. Dissolve in 2-3 tbsp warm water, swish and swallow. 90 tablet 3  . vitamin C (ASCORBIC  ACID) 500 MG tablet Take by mouth 1 day or 1 dose.     No current facility-administered medications for this visit.   Facility-Administered Medications Ordered in Other Visits  Medication Dose Route Frequency Provider Last Rate Last Dose  . sodium chloride 0.9 % injection 10 mL  10 mL Intravenous PRN Lloyd Huger, MD   10 mL at 11/03/14 1005    OBJECTIVE: Filed Vitals:   12/28/14 1009  BP: 111/73  Pulse: 69  Temp: 95.4 F (35.2 C)  Resp: 16     Body mass index is 23.36 kg/(m^2).    ECOG FS:0 - Asymptomatic  General: Well-developed, well-nourished, no acute distress. Eyes: anicteric sclera. HEENT: No palpable lymphadenopathy of left neck. Lungs: Clear to auscultation bilaterally. Heart: Regular rate and rhythm. No rubs, murmurs, or gallops. Abdomen: Soft, nontender, nondistended. No organomegaly noted, normoactive bowel sounds. Musculoskeletal: No edema, cyanosis, or clubbing. Neuro: Alert, answering all questions appropriately. Cranial nerves grossly intact. Skin: No rashes or petechiae noted. Psych: Normal affect.    LAB RESULTS:  Lab Results  Component Value Date   NA 132* 01/04/2015   K 4.0 01/04/2015   CL 99* 01/04/2015   CO2 27 01/04/2015   GLUCOSE 112* 01/04/2015   BUN 31* 01/04/2015   CREATININE 1.11 01/04/2015   CALCIUM 8.7* 01/04/2015   PROT 7.2 12/21/2014   ALBUMIN 4.0 12/21/2014   AST 17 12/21/2014   ALT 13* 12/21/2014   ALKPHOS 88 12/21/2014   BILITOT 0.4 12/21/2014   GFRNONAA >60 01/04/2015   GFRAA >60 01/04/2015    Lab Results  Component Value Date   WBC 7.1 01/04/2015   NEUTROABS 4.5 01/04/2015   HGB 10.3* 01/04/2015   HCT 31.0* 01/04/2015   MCV 97.4 01/04/2015   PLT 219 01/04/2015     STUDIES: No results found.  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  PLAN:    1. Tongue cancer: Outside pathology, imaging, and physician notes reviewed independently. Patient completed 3 cycles of induction  chemotherapy with Taxotere, cisplatin, and 5-FU. Proceed with cycle 3 of weekly cisplatin 20 mg/m2. Continue daily XRT. Return to clinic in 1 week for consideration of cycle 3.  2. Anemia: Secondary chemotherapy, monitor. 4. Hypotension: Monitor. Patient does not require IV fluids today. 5. Elevated creatinine: Monitor closely retreatment with cisplatin.   Patient expressed understanding and was in agreement with this plan. He also understands that He can call clinic at any time with any questions, concerns, or complaints.   Malignant neoplasm of base of tongue   Staging form: Lip and Oral Cavity, AJCC 7th  Edition     Clinical stage from 10/18/2014: Stage IVA (T2, N2, M0) - Signed by Lloyd Huger, MD on 10/18/2014   Lloyd Huger, MD   01/05/2015 4:54 PM

## 2015-01-05 NOTE — Progress Notes (Signed)
[place]  [phone]) [phone] [phone]) [phone]  ID: Mathew Robinson OB: 1955-08-17  MR#: [id number]  OEH#:[id number]  Patient Care Team: Emelia Loron, MD as PCP - General (Family Medicine)  CHIEF COMPLAINT:  Chief Complaint  Patient presents with  . Follow-up    Head/Neck Cancer    INTERVAL HISTORY:  Patient returns to clinic today for further evaluation and consideration of cycle 4 of weekly cisplatin. He continues to tolerate his chemotherapy and XRT well without significant side effects. He does not complain of dysphagia, but has some mild soreness. He denies any weakness or fatigue today. He denies any fevers. He currently feels well and is asymptomatic. He has no neurologic complaints. He has no chest pain or shortness of breath. He denies any nausea, vomiting, constipation, or diarrhea. He has no urinary complaints. Patient offers no specific complaints today.   REVIEW OF SYSTEMS:   Review of Systems  Constitutional: Negative for fever, weight loss and malaise/fatigue.  Respiratory: Negative.   Cardiovascular: Negative.     As per HPI. Otherwise, a complete review of systems is negatve.  PAST MEDICAL HISTORY: Past Medical History  Diagnosis Date  . Arthritis   . Cancer     HEAD/NECK/TONGUE CANCER  . Colon cancer   . Depression   . Hyperlipidemia   . HTN (hypertension)   . Squamous cell cancer of tongue     PAST SURGICAL HISTORY: Past Surgical History  Procedure Laterality Date  . Colectomy    . Splenectomy      FAMILY HISTORY Family History  Problem Relation Age of Onset  . Hypercholesterolemia      FAMILY HX  . Head & neck cancer Father        ADVANCED DIRECTIVES:    HEALTH MAINTENANCE: History  Substance Use Topics  . Smoking status: Former Smoker -- 1.50 packs/day for 20 years    Types: Cigarettes  . Smokeless tobacco: Former Systems developer    Quit date: 03/06/1985     Comment: HEAVY TOBACCO USE. QUIT GREATER THAN 15 YRS  AGO  . Alcohol Use: 0.0 oz/week    0 Standard drinks or equivalent per week     Comment: Patient states "very little     Colonoscopy:  PAP:  Bone density:  Lipid panel:  Allergies  Allergen Reactions  . Penicillin G     Other reaction(s): Itching of Skin, Localized superficial swelling of skin  . Penicillins Itching and Swelling  . Tramadol     Other reaction(s): Unknown    Current Outpatient Prescriptions  Medication Sig Dispense Refill  . amLODipine (NORVASC) 10 MG tablet Take 10 mg by mouth daily.    [name] atenolol-chlorthalidone (TENORETIC) 50-25 MG per tablet Take 1 tablet by mouth daily.  5  . cyclobenzaprine (FLEXERIL) 10 MG tablet Take 10 mg by mouth 3 (three) times daily as needed for muscle spasms.    . diazepam (VALIUM) 10 MG tablet Take 10 mg by mouth at bedtime.  0  . FENOFIBRATE PO Take 1 tablet by mouth 1 day or 1 dose.    . hydrochlorothiazide (MICROZIDE) 12.5 MG capsule Take 1 capsule by mouth 1 day or 1 dose.    . ibuprofen (ADVIL,MOTRIN) 800 MG tablet Take 1 tablet by mouth 3 (three) times daily as needed. PAIN    . lisinopril (PRINIVIL,ZESTRIL) 40 MG tablet Take 40 mg by mouth daily.  5  . mirtazapine (REMERON) 15 MG tablet Take 15 mg by mouth at bedtime.    [name]  NON FORMULARY Take 5 mLs by mouth 4 (four) times daily as needed.    . Omega-3 Fatty Acids (FISH OIL) 1000 MG CAPS Take 1 capsule by mouth.    . Oxycodone HCl 10 MG TABS Take 1 tablet (10 mg total) by mouth every 8 (eight) hours as needed. 90 tablet 0  . prochlorperazine (COMPAZINE) 10 MG tablet Take 1 tablet (10 mg total) by mouth every 6 (six) hours as needed for nausea or vomiting. 30 tablet 2  . simvastatin (ZOCOR) 20 MG tablet Take 20 mg by mouth at bedtime.  5  . sucralfate (CARAFATE) 1 G tablet Take 1 tablet (1 g total) by mouth 3 (three) times daily. Dissolve in 2-3 tbsp warm water, swish and swallow. 90 tablet 3  . vitamin C (ASCORBIC ACID) 500 MG tablet Take by mouth 1 day or 1 dose.    .  gabapentin (NEURONTIN) 600 MG tablet Take 600 mg by mouth 3 (three) times daily.  3   No current facility-administered medications for this visit.   Facility-Administered Medications Ordered in Other Visits  Medication Dose Route Frequency Provider Last Rate Last Dose  . sodium chloride 0.9 % injection 10 mL  10 mL Intravenous PRN Lloyd Huger, MD   10 mL at 11/03/14 1005    OBJECTIVE: Filed Vitals:   01/04/15 0936  BP: 103/68  Pulse: 61  Temp: 96 F (35.6 C)  Resp: 18     Body mass index is 24.11 kg/(m^2).    ECOG FS:0 - Asymptomatic  General: Well-developed, well-nourished, no acute distress. Eyes: anicteric sclera. HEENT: No palpable lymphadenopathy of left neck, mild erythema in oropharynx. Lungs: Clear to auscultation bilaterally. Heart: Regular rate and rhythm. No rubs, murmurs, or gallops. Abdomen: Soft, nontender, nondistended. No organomegaly noted, normoactive bowel sounds. Musculoskeletal: No edema, cyanosis, or clubbing. Neuro: Alert, answering all questions appropriately. Cranial nerves grossly intact. Skin: No rashes or petechiae noted. Psych: Normal affect.    LAB RESULTS:  Lab Results  Component Value Date   NA 132* 01/04/2015   K 4.0 01/04/2015   CL 99* 01/04/2015   CO2 27 01/04/2015   GLUCOSE 112* 01/04/2015   BUN 31* 01/04/2015   CREATININE 1.11 01/04/2015   CALCIUM 8.7* 01/04/2015   PROT 7.2 12/21/2014   ALBUMIN 4.0 12/21/2014   AST 17 12/21/2014   ALT 13* 12/21/2014   ALKPHOS 88 12/21/2014   BILITOT 0.4 12/21/2014   GFRNONAA >60 01/04/2015   GFRAA >60 01/04/2015    Lab Results  Component Value Date   WBC 7.1 01/04/2015   NEUTROABS 4.5 01/04/2015   HGB 10.3* 01/04/2015   HCT 31.0* 01/04/2015   MCV 97.4 01/04/2015   PLT 219 01/04/2015     STUDIES: No results found.  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  PLAN:    1. Tongue cancer: Outside pathology, imaging, and physician notes reviewed  independently. Patient completed 3 cycles of induction chemotherapy with Taxotere, cisplatin, and 5-FU. Proceed with cycle 3 of weekly cisplatin 20 mg/m2. Continue daily XRT. Return to clinic in 1 week for consideration of cycle 3.  2. Anemia: Secondary chemotherapy, monitor. 3. Hypotension: Monitor. Patient does not require IV fluids today. 4. Elevated creatinine: Monitor closely retreatment with cisplatin. 5. Mild oropharyngeal tenderness: No intervention needed, consider Carafate in the near future.   Patient expressed understanding and was in agreement with this plan. He also understands that He can call clinic at any time with any questions, concerns,  or complaints.   Malignant neoplasm of base of tongue   Staging form: Lip and Oral Cavity, AJCC 7th Edition     Clinical stage from 10/18/2014: Stage IVA (T2, N2, M0) - Signed by Lloyd Huger, MD on 10/18/2014   Lloyd Huger, MD   01/05/2015 4:55 PM

## 2015-01-08 ENCOUNTER — Ambulatory Visit
Admission: RE | Admit: 2015-01-08 | Discharge: 2015-01-08 | Disposition: A | Discharge status: Home / Self Care | Payer: Medicare Other | Source: Ambulatory Visit | Attending: Radiation Oncology | Admitting: Radiation Oncology

## 2015-01-08 DIAGNOSIS — Z510 Encounter for antineoplastic radiation therapy: Secondary | ICD-10-CM | POA: Diagnosis not present

## 2015-01-09 ENCOUNTER — Ambulatory Visit
Admission: RE | Admit: 2015-01-09 | Discharge: 2015-01-09 | Disposition: A | Discharge status: Home / Self Care | Payer: Medicare Other | Source: Ambulatory Visit | Attending: Radiation Oncology | Admitting: Radiation Oncology

## 2015-01-09 DIAGNOSIS — Z510 Encounter for antineoplastic radiation therapy: Secondary | ICD-10-CM | POA: Diagnosis not present

## 2015-01-10 ENCOUNTER — Ambulatory Visit
Admission: RE | Admit: 2015-01-10 | Discharge: 2015-01-10 | Disposition: A | Discharge status: Home / Self Care | Payer: Medicare Other | Source: Ambulatory Visit | Attending: Radiation Oncology | Admitting: Radiation Oncology

## 2015-01-10 DIAGNOSIS — Z510 Encounter for antineoplastic radiation therapy: Secondary | ICD-10-CM | POA: Diagnosis not present

## 2015-01-11 ENCOUNTER — Inpatient Hospital Stay (HOSPITAL_BASED_OUTPATIENT_CLINIC_OR_DEPARTMENT_OTHER): Payer: Medicare Other | Admitting: Oncology

## 2015-01-11 ENCOUNTER — Inpatient Hospital Stay: Payer: Medicare Other

## 2015-01-11 ENCOUNTER — Ambulatory Visit
Admission: RE | Admit: 2015-01-11 | Discharge: 2015-01-11 | Disposition: A | Discharge status: Home / Self Care | Payer: Medicare Other | Source: Ambulatory Visit | Attending: Radiation Oncology | Admitting: Radiation Oncology

## 2015-01-11 VITALS — BP 102/66 | HR 64 | Temp 96.0°F | Resp 20 | Wt 155.2 lb

## 2015-01-11 VITALS — BP 110/70 | HR 66 | Resp 18

## 2015-01-11 DIAGNOSIS — C01 Malignant neoplasm of base of tongue: Secondary | ICD-10-CM

## 2015-01-11 DIAGNOSIS — T451X5S Adverse effect of antineoplastic and immunosuppressive drugs, sequela: Secondary | ICD-10-CM

## 2015-01-11 DIAGNOSIS — R944 Abnormal results of kidney function studies: Secondary | ICD-10-CM

## 2015-01-11 DIAGNOSIS — Z79899 Other long term (current) drug therapy: Secondary | ICD-10-CM

## 2015-01-11 DIAGNOSIS — D6481 Anemia due to antineoplastic chemotherapy: Secondary | ICD-10-CM

## 2015-01-11 DIAGNOSIS — I959 Hypotension, unspecified: Secondary | ICD-10-CM

## 2015-01-11 DIAGNOSIS — Z5111 Encounter for antineoplastic chemotherapy: Secondary | ICD-10-CM | POA: Diagnosis not present

## 2015-01-11 DIAGNOSIS — Z510 Encounter for antineoplastic radiation therapy: Secondary | ICD-10-CM | POA: Diagnosis not present

## 2015-01-11 DIAGNOSIS — C760 Malignant neoplasm of head, face and neck: Secondary | ICD-10-CM

## 2015-01-11 LAB — CBC WITH DIFFERENTIAL/PLATELET
BASOS ABS: 0 10*3/uL (ref 0–0.1)
Basophils Relative: 1 %
EOS PCT: 4 %
Eosinophils Absolute: 0.2 10*3/uL (ref 0–0.7)
HEMATOCRIT: 31.4 % — AB (ref 40.0–52.0)
Hemoglobin: 10.4 g/dL — ABNORMAL LOW (ref 13.0–18.0)
LYMPHS ABS: 1.2 10*3/uL (ref 1.0–3.6)
Lymphocytes Relative: 23 %
MCH: 32.5 pg (ref 26.0–34.0)
MCHC: 33.1 g/dL (ref 32.0–36.0)
MCV: 98.1 fL (ref 80.0–100.0)
Monocytes Absolute: 0.9 10*3/uL (ref 0.2–1.0)
Monocytes Relative: 16 %
NEUTROS ABS: 3.1 10*3/uL (ref 1.4–6.5)
Neutrophils Relative %: 56 %
PLATELETS: 269 10*3/uL (ref 150–440)
RBC: 3.2 MIL/uL — AB (ref 4.40–5.90)
RDW: 19.4 % — ABNORMAL HIGH (ref 11.5–14.5)
WBC: 5.4 10*3/uL (ref 3.8–10.6)

## 2015-01-11 LAB — BASIC METABOLIC PANEL
ANION GAP: 5 (ref 5–15)
BUN: 37 mg/dL — ABNORMAL HIGH (ref 6–20)
CALCIUM: 8.8 mg/dL — AB (ref 8.9–10.3)
CHLORIDE: 102 mmol/L (ref 101–111)
CO2: 28 mmol/L (ref 22–32)
CREATININE: 1.58 mg/dL — AB (ref 0.61–1.24)
GFR, EST AFRICAN AMERICAN: 54 mL/min — AB (ref >60)
GFR, EST NON AFRICAN AMERICAN: 47 mL/min — AB (ref >60)
Glucose, Bld: 113 mg/dL — ABNORMAL HIGH (ref 65–99)
Potassium: 3.8 mmol/L (ref 3.5–5.1)
SODIUM: 135 mmol/L (ref 135–145)

## 2015-01-11 MED ORDER — SODIUM CHLORIDE 0.9 % IV SOLN
Freq: Once | INTRAVENOUS | Status: AC
  Administered 2015-01-11: 12:00:00 via INTRAVENOUS
  Filled 2015-01-11: qty 5

## 2015-01-11 MED ORDER — CISPLATIN CHEMO INJECTION 100MG/100ML
20.0000 mg/m2 | Freq: Once | INTRAVENOUS | Status: AC
  Administered 2015-01-11: 37 mg via INTRAVENOUS
  Filled 2015-01-11: qty 37

## 2015-01-11 MED ORDER — HEPARIN SOD (PORK) LOCK FLUSH 100 UNIT/ML IV SOLN
500.0000 [IU] | Freq: Once | INTRAVENOUS | Status: AC | PRN
  Administered 2015-01-11: 500 [IU]
  Filled 2015-01-11: qty 5

## 2015-01-11 MED ORDER — SODIUM CHLORIDE 0.9 % IV SOLN
Freq: Once | INTRAVENOUS | Status: AC
  Administered 2015-01-11: 10:00:00 via INTRAVENOUS
  Filled 2015-01-11: qty 1000

## 2015-01-11 MED ORDER — PALONOSETRON HCL INJECTION 0.25 MG/5ML
0.2500 mg | Freq: Once | INTRAVENOUS | Status: AC
  Administered 2015-01-11: 0.25 mg via INTRAVENOUS
  Filled 2015-01-11: qty 5

## 2015-01-11 MED ORDER — POTASSIUM CHLORIDE 2 MEQ/ML IV SOLN
Freq: Once | INTRAVENOUS | Status: AC
  Administered 2015-01-11: 10:00:00 via INTRAVENOUS
  Filled 2015-01-11: qty 1000

## 2015-01-12 ENCOUNTER — Ambulatory Visit
Admission: RE | Admit: 2015-01-12 | Discharge: 2015-01-12 | Disposition: A | Discharge status: Home / Self Care | Payer: Medicare Other | Source: Ambulatory Visit | Attending: Radiation Oncology | Admitting: Radiation Oncology

## 2015-01-12 DIAGNOSIS — Z510 Encounter for antineoplastic radiation therapy: Secondary | ICD-10-CM | POA: Diagnosis not present

## 2015-01-15 ENCOUNTER — Ambulatory Visit: Payer: Medicare Other

## 2015-01-16 ENCOUNTER — Ambulatory Visit
Admission: RE | Admit: 2015-01-16 | Discharge: 2015-01-16 | Disposition: A | Discharge status: Home / Self Care | Payer: Medicare Other | Source: Ambulatory Visit | Attending: Radiation Oncology | Admitting: Radiation Oncology

## 2015-01-16 DIAGNOSIS — Z510 Encounter for antineoplastic radiation therapy: Secondary | ICD-10-CM | POA: Diagnosis not present

## 2015-01-17 ENCOUNTER — Ambulatory Visit
Admission: RE | Admit: 2015-01-17 | Discharge: 2015-01-17 | Disposition: A | Discharge status: Home / Self Care | Payer: Medicare Other | Source: Ambulatory Visit | Attending: Radiation Oncology | Admitting: Radiation Oncology

## 2015-01-17 DIAGNOSIS — Z510 Encounter for antineoplastic radiation therapy: Secondary | ICD-10-CM | POA: Diagnosis not present

## 2015-01-17 NOTE — Progress Notes (Signed)
[place]  Telephone:(336) [phone] [phone]) [phone]  ID: Mathew Robinson OB: 12/26/1955  MR#: [id number]  EXB#:[id number]  Patient Care Team: Emelia Loron, MD as PCP - General (Family Medicine)  CHIEF COMPLAINT:  Chief Complaint  Patient presents with  . Follow-up  . Chemotherapy    INTERVAL HISTORY:  Patient returns to clinic today for further evaluation and consideration of cycle 5 of weekly cisplatin. He continues to tolerate his chemotherapy and XRT well without significant side effects. He does not complain of dysphagia, but has some mild soreness. He denies any weakness or fatigue today. He denies any fevers. He has no neurologic complaints. He has no chest pain or shortness of breath. He denies any nausea, vomiting, constipation, or diarrhea. He has no urinary complaints. Patient offers no specific complaints today.   REVIEW OF SYSTEMS:   Review of Systems  Constitutional: Negative for fever, weight loss and malaise/fatigue.  HENT: Negative.   Respiratory: Negative.   Cardiovascular: Negative.     As per HPI. Otherwise, a complete review of systems is negatve.  PAST MEDICAL HISTORY: Past Medical History  Diagnosis Date  . Arthritis   . Cancer     HEAD/NECK/TONGUE CANCER  . Colon cancer   . Depression   . Hyperlipidemia   . HTN (hypertension)   . Squamous cell cancer of tongue     PAST SURGICAL HISTORY: Past Surgical History  Procedure Laterality Date  . Colectomy    . Splenectomy      FAMILY HISTORY Family History  Problem Relation Age of Onset  . Hypercholesterolemia      FAMILY HX  . Head & neck cancer Father        ADVANCED DIRECTIVES:    HEALTH MAINTENANCE: History  Substance Use Topics  . Smoking status: Former Smoker -- 1.50 packs/day for 20 years    Types: Cigarettes  . Smokeless tobacco: Former Systems developer    Quit date: 03/06/1985     Comment: HEAVY TOBACCO USE. QUIT GREATER THAN 15 YRS AGO  . Alcohol Use: 0.0  oz/week    0 Standard drinks or equivalent per week     Comment: Patient states "very little     Colonoscopy:  PAP:  Bone density:  Lipid panel:  Allergies  Allergen Reactions  . Penicillin G     Other reaction(s): Itching of Skin, Localized superficial swelling of skin  . Penicillins Itching and Swelling  . Tramadol     Other reaction(s): Unknown    Current Outpatient Prescriptions  Medication Sig Dispense Refill  . amLODipine (NORVASC) 10 MG tablet Take 10 mg by mouth daily.    [name] atenolol-chlorthalidone (TENORETIC) 50-25 MG per tablet Take 1 tablet by mouth daily.  5  . cyclobenzaprine (FLEXERIL) 10 MG tablet Take 10 mg by mouth 3 (three) times daily as needed for muscle spasms.    . diazepam (VALIUM) 10 MG tablet Take 10 mg by mouth at bedtime.  0  . FENOFIBRATE PO Take 1 tablet by mouth 1 day or 1 dose.    . gabapentin (NEURONTIN) 600 MG tablet Take 600 mg by mouth 3 (three) times daily.  3  . hydrochlorothiazide (MICROZIDE) 12.5 MG capsule Take 1 capsule by mouth 1 day or 1 dose.    . ibuprofen (ADVIL,MOTRIN) 800 MG tablet Take 1 tablet by mouth 3 (three) times daily as needed. PAIN    . lisinopril (PRINIVIL,ZESTRIL) 40 MG tablet Take 40 mg by mouth daily.  5  . mirtazapine (  REMERON) 15 MG tablet Take 15 mg by mouth at bedtime.    . NON FORMULARY Take 5 mLs by mouth 4 (four) times daily as needed.    . Omega-3 Fatty Acids (FISH OIL) 1000 MG CAPS Take 1 capsule by mouth.    . Oxycodone HCl 10 MG TABS Take 1 tablet (10 mg total) by mouth every 8 (eight) hours as needed. 90 tablet 0  . prochlorperazine (COMPAZINE) 10 MG tablet Take 1 tablet (10 mg total) by mouth every 6 (six) hours as needed for nausea or vomiting. 30 tablet 2  . simvastatin (ZOCOR) 20 MG tablet Take 20 mg by mouth at bedtime.  5  . sucralfate (CARAFATE) 1 G tablet Take 1 tablet (1 g total) by mouth 3 (three) times daily. Dissolve in 2-3 tbsp warm water, swish and swallow. 90 tablet 3  . vitamin C (ASCORBIC  ACID) 500 MG tablet Take by mouth 1 day or 1 dose.     No current facility-administered medications for this visit.   Facility-Administered Medications Ordered in Other Visits  Medication Dose Route Frequency Provider Last Rate Last Dose  . sodium chloride 0.9 % injection 10 mL  10 mL Intravenous PRN Lloyd Huger, MD   10 mL at 11/03/14 1005    OBJECTIVE: Filed Vitals:   01/11/15 0914  BP: 102/66  Pulse: 64  Temp: 96 F (35.6 C)  Resp: 20     Body mass index is 22.94 kg/(m^2).    ECOG FS:0 - Asymptomatic  General: Well-developed, well-nourished, no acute distress. Eyes: anicteric sclera. HEENT: No palpable lymphadenopathy of left neck, mild erythema in oropharynx. Lungs: Clear to auscultation bilaterally. Heart: Regular rate and rhythm. No rubs, murmurs, or gallops. Abdomen: Soft, nontender, nondistended. No organomegaly noted, normoactive bowel sounds. Musculoskeletal: No edema, cyanosis, or clubbing. Neuro: Alert, answering all questions appropriately. Cranial nerves grossly intact. Skin: No rashes or petechiae noted. Psych: Normal affect.    LAB RESULTS:  Lab Results  Component Value Date   NA 135 01/11/2015   K 3.8 01/11/2015   CL 102 01/11/2015   CO2 28 01/11/2015   GLUCOSE 113* 01/11/2015   BUN 37* 01/11/2015   CREATININE 1.58* 01/11/2015   CALCIUM 8.8* 01/11/2015   PROT 7.2 12/21/2014   ALBUMIN 4.0 12/21/2014   AST 17 12/21/2014   ALT 13* 12/21/2014   ALKPHOS 88 12/21/2014   BILITOT 0.4 12/21/2014   GFRNONAA 47* 01/11/2015   GFRAA 54* 01/11/2015    Lab Results  Component Value Date   WBC 5.4 01/11/2015   NEUTROABS 3.1 01/11/2015   HGB 10.4* 01/11/2015   HCT 31.4* 01/11/2015   MCV 98.1 01/11/2015   PLT 269 01/11/2015     STUDIES: No results found.  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  PLAN:    1. Tongue cancer: Outside pathology, imaging, and physician notes reviewed independently. Patient completed 3  cycles of induction chemotherapy with Taxotere, cisplatin, and 5-FU. Proceed with cycle 5 of weekly cisplatin 20 mg/m2. Continue daily XRT. Return to clinic in 1 week for consideration of cycle 6.  2. Anemia: Secondary chemotherapy, monitor. 3. Hypotension: Monitor. Patient does not require IV fluids today. 4. Elevated creatinine: Monitor closely retreatment with cisplatin. 5. Mild oropharyngeal tenderness: No intervention needed, consider Carafate in the near future.   Patient expressed understanding and was in agreement with this plan. He also understands that He can call clinic at any time with any questions, concerns, or complaints.  Malignant neoplasm of base of tongue   Staging form: Lip and Oral Cavity, AJCC 7th Edition     Clinical stage from 10/18/2014: Stage IVA (T2, N2, M0) - Signed by Lloyd Huger, MD on 10/18/2014   Lloyd Huger, MD   01/17/2015 12:44 PM

## 2015-01-18 ENCOUNTER — Ambulatory Visit
Admission: RE | Admit: 2015-01-18 | Discharge: 2015-01-18 | Disposition: A | Discharge status: Home / Self Care | Payer: Medicare Other | Source: Ambulatory Visit | Attending: Radiation Oncology | Admitting: Radiation Oncology

## 2015-01-18 ENCOUNTER — Inpatient Hospital Stay: Payer: Medicare Other

## 2015-01-18 ENCOUNTER — Inpatient Hospital Stay (HOSPITAL_BASED_OUTPATIENT_CLINIC_OR_DEPARTMENT_OTHER): Payer: Medicare Other | Admitting: Oncology

## 2015-01-18 ENCOUNTER — Inpatient Hospital Stay: Payer: Medicare Other | Attending: Oncology

## 2015-01-18 VITALS — BP 97/64 | HR 64 | Temp 96.8°F | Resp 20 | Wt 145.9 lb

## 2015-01-18 DIAGNOSIS — R5383 Other fatigue: Secondary | ICD-10-CM | POA: Diagnosis not present

## 2015-01-18 DIAGNOSIS — R531 Weakness: Secondary | ICD-10-CM | POA: Diagnosis not present

## 2015-01-18 DIAGNOSIS — Z5111 Encounter for antineoplastic chemotherapy: Secondary | ICD-10-CM | POA: Insufficient documentation

## 2015-01-18 DIAGNOSIS — C01 Malignant neoplasm of base of tongue: Secondary | ICD-10-CM | POA: Insufficient documentation

## 2015-01-18 DIAGNOSIS — M1990 Unspecified osteoarthritis, unspecified site: Secondary | ICD-10-CM | POA: Insufficient documentation

## 2015-01-18 DIAGNOSIS — Z87891 Personal history of nicotine dependence: Secondary | ICD-10-CM | POA: Insufficient documentation

## 2015-01-18 DIAGNOSIS — I959 Hypotension, unspecified: Secondary | ICD-10-CM | POA: Insufficient documentation

## 2015-01-18 DIAGNOSIS — E785 Hyperlipidemia, unspecified: Secondary | ICD-10-CM

## 2015-01-18 DIAGNOSIS — C760 Malignant neoplasm of head, face and neck: Secondary | ICD-10-CM

## 2015-01-18 DIAGNOSIS — Z79899 Other long term (current) drug therapy: Secondary | ICD-10-CM | POA: Diagnosis not present

## 2015-01-18 DIAGNOSIS — R630 Anorexia: Secondary | ICD-10-CM

## 2015-01-18 DIAGNOSIS — R944 Abnormal results of kidney function studies: Secondary | ICD-10-CM | POA: Diagnosis not present

## 2015-01-18 DIAGNOSIS — Z510 Encounter for antineoplastic radiation therapy: Secondary | ICD-10-CM | POA: Diagnosis not present

## 2015-01-18 DIAGNOSIS — D6481 Anemia due to antineoplastic chemotherapy: Secondary | ICD-10-CM

## 2015-01-18 DIAGNOSIS — F329 Major depressive disorder, single episode, unspecified: Secondary | ICD-10-CM | POA: Diagnosis not present

## 2015-01-18 DIAGNOSIS — J392 Other diseases of pharynx: Secondary | ICD-10-CM | POA: Insufficient documentation

## 2015-01-18 DIAGNOSIS — I10 Essential (primary) hypertension: Secondary | ICD-10-CM | POA: Diagnosis not present

## 2015-01-18 DIAGNOSIS — T451X5S Adverse effect of antineoplastic and immunosuppressive drugs, sequela: Secondary | ICD-10-CM | POA: Diagnosis not present

## 2015-01-18 DIAGNOSIS — R5381 Other malaise: Secondary | ICD-10-CM | POA: Diagnosis not present

## 2015-01-18 LAB — CBC WITH DIFFERENTIAL/PLATELET
BASOS PCT: 1 %
Basophils Absolute: 0.1 10*3/uL (ref 0–0.1)
EOS PCT: 2 %
Eosinophils Absolute: 0.1 10*3/uL (ref 0–0.7)
HEMATOCRIT: 34.2 % — AB (ref 40.0–52.0)
HEMOGLOBIN: 11.6 g/dL — AB (ref 13.0–18.0)
LYMPHS PCT: 15 %
Lymphs Abs: 1.1 10*3/uL (ref 1.0–3.6)
MCH: 33.1 pg (ref 26.0–34.0)
MCHC: 34 g/dL (ref 32.0–36.0)
MCV: 97.4 fL (ref 80.0–100.0)
MONO ABS: 0.9 10*3/uL (ref 0.2–1.0)
MONOS PCT: 12 %
Neutro Abs: 5.2 10*3/uL (ref 1.4–6.5)
Neutrophils Relative %: 70 %
Platelets: 306 10*3/uL (ref 150–440)
RBC: 3.51 MIL/uL — ABNORMAL LOW (ref 4.40–5.90)
RDW: 19.5 % — ABNORMAL HIGH (ref 11.5–14.5)
WBC: 7.4 10*3/uL (ref 3.8–10.6)

## 2015-01-18 LAB — BASIC METABOLIC PANEL
Anion gap: 7 (ref 5–15)
BUN: 49 mg/dL — ABNORMAL HIGH (ref 6–20)
CALCIUM: 8.9 mg/dL (ref 8.9–10.3)
CO2: 29 mmol/L (ref 22–32)
Chloride: 98 mmol/L — ABNORMAL LOW (ref 101–111)
Creatinine, Ser: 1.49 mg/dL — ABNORMAL HIGH (ref 0.61–1.24)
GFR calc non Af Amer: 50 mL/min — ABNORMAL LOW (ref >60)
GFR, EST AFRICAN AMERICAN: 58 mL/min — AB (ref >60)
Glucose, Bld: 121 mg/dL — ABNORMAL HIGH (ref 65–99)
Potassium: 4.1 mmol/L (ref 3.5–5.1)
Sodium: 134 mmol/L — ABNORMAL LOW (ref 135–145)

## 2015-01-18 MED ORDER — SODIUM CHLORIDE 0.9 % IV SOLN: Freq: Once | INTRAVENOUS | Status: DC

## 2015-01-18 MED ORDER — SODIUM CHLORIDE 0.9 % IV SOLN: 20.0000 mg/m2 | Freq: Once | INTRAVENOUS | Status: DC

## 2015-01-18 MED ORDER — POTASSIUM CHLORIDE 2 MEQ/ML IV SOLN: Freq: Once | INTRAVENOUS | Status: DC

## 2015-01-18 MED ORDER — SODIUM CHLORIDE 0.9 % IV SOLN
Freq: Once | INTRAVENOUS | Status: AC
  Administered 2015-01-18: 11:00:00 via INTRAVENOUS
  Filled 2015-01-18: qty 1000

## 2015-01-18 MED ORDER — HEPARIN SOD (PORK) LOCK FLUSH 100 UNIT/ML IV SOLN
500.0000 [IU] | Freq: Once | INTRAVENOUS | Status: AC | PRN
  Administered 2015-01-18: 500 [IU]
  Filled 2015-01-18: qty 5

## 2015-01-18 MED ORDER — PALONOSETRON HCL INJECTION 0.25 MG/5ML: 0.2500 mg | Freq: Once | INTRAVENOUS | Status: DC

## 2015-01-19 ENCOUNTER — Ambulatory Visit
Admission: RE | Admit: 2015-01-19 | Discharge: 2015-01-19 | Disposition: A | Discharge status: Home / Self Care | Payer: Medicare Other | Source: Ambulatory Visit | Attending: Radiation Oncology | Admitting: Radiation Oncology

## 2015-01-19 DIAGNOSIS — Z510 Encounter for antineoplastic radiation therapy: Secondary | ICD-10-CM | POA: Diagnosis not present

## 2015-01-22 ENCOUNTER — Ambulatory Visit
Admission: RE | Admit: 2015-01-22 | Discharge: 2015-01-22 | Disposition: A | Discharge status: Home / Self Care | Payer: Medicare Other | Source: Ambulatory Visit | Attending: Radiation Oncology | Admitting: Radiation Oncology

## 2015-01-22 DIAGNOSIS — Z510 Encounter for antineoplastic radiation therapy: Secondary | ICD-10-CM | POA: Diagnosis not present

## 2015-01-23 ENCOUNTER — Ambulatory Visit
Admission: RE | Admit: 2015-01-23 | Discharge: 2015-01-23 | Disposition: A | Discharge status: Home / Self Care | Payer: Medicare Other | Source: Ambulatory Visit | Attending: Radiation Oncology | Admitting: Radiation Oncology

## 2015-01-23 DIAGNOSIS — Z510 Encounter for antineoplastic radiation therapy: Secondary | ICD-10-CM | POA: Diagnosis not present

## 2015-01-23 NOTE — Progress Notes (Signed)
[place]  [phone] [phone]  ID: Mathew Robinson OB: November 14, 1955  MR#: [id number]  JOA#:[id number]  Patient Care Team: Emelia Loron, MD as PCP - General (Family Medicine)  CHIEF COMPLAINT:  Chief Complaint  Patient presents with  . Follow-up    head and neck cancer    INTERVAL HISTORY:  Patient returns to clinic today for further evaluation and consideration of cycle 6 of weekly cisplatin. He feels more weak and fatigued this past week. He also has a decreased appetite. He does not complain of dysphagia, but has some mild soreness. He denies any fevers. He has no neurologic complaints. He has no chest pain or shortness of breath. He denies any nausea, vomiting, constipation, or diarrhea. He has no urinary complaints. Patient offers no further specific complaints today.   REVIEW OF SYSTEMS:   Review of Systems  Constitutional: Positive for malaise/fatigue. Negative for fever and weight loss.  HENT: Negative.   Respiratory: Negative.   Cardiovascular: Negative.   Neurological: Positive for weakness.    As per HPI. Otherwise, a complete review of systems is negatve.  PAST MEDICAL HISTORY: Past Medical History  Diagnosis Date  . Arthritis   . Cancer     HEAD/NECK/TONGUE CANCER  . Colon cancer   . Depression   . Hyperlipidemia   . HTN (hypertension)   . Squamous cell cancer of tongue     PAST SURGICAL HISTORY: Past Surgical History  Procedure Laterality Date  . Colectomy    . Splenectomy      FAMILY HISTORY Family History  Problem Relation Age of Onset  . Hypercholesterolemia      FAMILY HX  . Head & neck cancer Father        ADVANCED DIRECTIVES:    HEALTH MAINTENANCE: History  Substance Use Topics  . Smoking status: Former Smoker -- 1.50 packs/day for 20 years    Types: Cigarettes  . Smokeless tobacco: Former Systems developer    Quit date: 03/06/1985     Comment: HEAVY TOBACCO USE. QUIT GREATER THAN 15 YRS AGO  .  Alcohol Use: 0.0 oz/week    0 Standard drinks or equivalent per week     Comment: Patient states "very little     Colonoscopy:  PAP:  Bone density:  Lipid panel:  Allergies  Allergen Reactions  . Penicillin G     Other reaction(s): Itching of Skin, Localized superficial swelling of skin  . Penicillins Itching and Swelling  . Tramadol     Other reaction(s): Unknown    Current Outpatient Prescriptions  Medication Sig Dispense Refill  . amLODipine (NORVASC) 10 MG tablet Take 10 mg by mouth daily.    [name] atenolol-chlorthalidone (TENORETIC) 50-25 MG per tablet Take 1 tablet by mouth daily.  5  . cyclobenzaprine (FLEXERIL) 10 MG tablet Take 10 mg by mouth 3 (three) times daily as needed for muscle spasms.    . diazepam (VALIUM) 10 MG tablet Take 10 mg by mouth at bedtime.  0  . FENOFIBRATE PO Take 1 tablet by mouth 1 day or 1 dose.    . gabapentin (NEURONTIN) 600 MG tablet Take 600 mg by mouth 3 (three) times daily.  3  . hydrochlorothiazide (MICROZIDE) 12.5 MG capsule Take 1 capsule by mouth 1 day or 1 dose.    . ibuprofen (ADVIL,MOTRIN) 800 MG tablet Take 1 tablet by mouth 3 (three) times daily as needed. PAIN    . lisinopril (PRINIVIL,ZESTRIL) 40 MG tablet Take 40 mg by  mouth daily.  5  . mirtazapine (REMERON) 15 MG tablet Take 15 mg by mouth at bedtime.    . NON FORMULARY Take 5 mLs by mouth 4 (four) times daily as needed.    . Omega-3 Fatty Acids (FISH OIL) 1000 MG CAPS Take 1 capsule by mouth.    . Oxycodone HCl 10 MG TABS Take 1 tablet (10 mg total) by mouth every 8 (eight) hours as needed. 90 tablet 0  . prochlorperazine (COMPAZINE) 10 MG tablet Take 1 tablet (10 mg total) by mouth every 6 (six) hours as needed for nausea or vomiting. 30 tablet 2  . simvastatin (ZOCOR) 20 MG tablet Take 20 mg by mouth at bedtime.  5  . sucralfate (CARAFATE) 1 G tablet Take 1 tablet (1 g total) by mouth 3 (three) times daily. Dissolve in 2-3 tbsp warm water, swish and swallow. 90 tablet 3  .  vitamin C (ASCORBIC ACID) 500 MG tablet Take by mouth 1 day or 1 dose.     No current facility-administered medications for this visit.   Facility-Administered Medications Ordered in Other Visits  Medication Dose Route Frequency Provider Last Rate Last Dose  . sodium chloride 0.9 % injection 10 mL  10 mL Intravenous PRN Lloyd Huger, MD   10 mL at 11/03/14 1005    OBJECTIVE: Filed Vitals:   01/18/15 1011  BP: 97/64  Pulse: 64  Temp: 96.8 F (36 C)  Resp: 20     Body mass index is 21.57 kg/(m^2).    ECOG FS:0 - Asymptomatic  General: Well-developed, well-nourished, no acute distress. Eyes: anicteric sclera. HEENT: No palpable lymphadenopathy of left neck, mild erythema in oropharynx. Lungs: Clear to auscultation bilaterally. Heart: Regular rate and rhythm. No rubs, murmurs, or gallops. Abdomen: Soft, nontender, nondistended. No organomegaly noted, normoactive bowel sounds. Musculoskeletal: No edema, cyanosis, or clubbing. Neuro: Alert, answering all questions appropriately. Cranial nerves grossly intact. Skin: No rashes or petechiae noted. Psych: Normal affect.    LAB RESULTS:  Lab Results  Component Value Date   NA 134* 01/18/2015   K 4.1 01/18/2015   CL 98* 01/18/2015   CO2 29 01/18/2015   GLUCOSE 121* 01/18/2015   BUN 49* 01/18/2015   CREATININE 1.49* 01/18/2015   CALCIUM 8.9 01/18/2015   PROT 7.2 12/21/2014   ALBUMIN 4.0 12/21/2014   AST 17 12/21/2014   ALT 13* 12/21/2014   ALKPHOS 88 12/21/2014   BILITOT 0.4 12/21/2014   GFRNONAA 50* 01/18/2015   GFRAA 58* 01/18/2015    Lab Results  Component Value Date   WBC 7.4 01/18/2015   NEUTROABS 5.2 01/18/2015   HGB 11.6* 01/18/2015   HCT 34.2* 01/18/2015   MCV 97.4 01/18/2015   PLT 306 01/18/2015     STUDIES: No results found.  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  PLAN:    1. Tongue cancer: Outside pathology, imaging, and physician notes reviewed independently.  Patient completed 3 cycles of induction chemotherapy with Taxotere, cisplatin, and 5-FU. Will delay cycle 6 of weekly cisplatin 20 mg/m2. Continue daily XRT which will be completed on February 01, 2015. Return to clinic in 1 week for reconsideration of cycle 6.  2. Anemia: Secondary chemotherapy, monitor. 3. Hypotension: Given patient's increased weakness and fatigue and poor appetite, will give IV fluids today rather than treatment. 4. Elevated creatinine: IV fluids as above. 5. Mild oropharyngeal tenderness: No intervention needed, consider Carafate in the near future.   Patient expressed understanding and was  in agreement with this plan. He also understands that He can call clinic at any time with any questions, concerns, or complaints.   Malignant neoplasm of base of tongue   Staging form: Lip and Oral Cavity, AJCC 7th Edition     Clinical stage from 10/18/2014: Stage IVA (T2, N2, M0) - Signed by Lloyd Huger, MD on 10/18/2014   Lloyd Huger, MD   01/23/2015 12:42 PM

## 2015-01-24 ENCOUNTER — Ambulatory Visit
Admission: RE | Admit: 2015-01-24 | Discharge: 2015-01-24 | Disposition: A | Discharge status: Home / Self Care | Payer: Medicare Other | Source: Ambulatory Visit | Attending: Radiation Oncology | Admitting: Radiation Oncology

## 2015-01-24 DIAGNOSIS — Z510 Encounter for antineoplastic radiation therapy: Secondary | ICD-10-CM | POA: Diagnosis not present

## 2015-01-25 ENCOUNTER — Inpatient Hospital Stay: Payer: Medicare Other

## 2015-01-25 ENCOUNTER — Ambulatory Visit
Admission: RE | Admit: 2015-01-25 | Discharge: 2015-01-25 | Disposition: A | Discharge status: Home / Self Care | Payer: Medicare Other | Source: Ambulatory Visit | Attending: Radiation Oncology | Admitting: Radiation Oncology

## 2015-01-25 ENCOUNTER — Inpatient Hospital Stay (HOSPITAL_BASED_OUTPATIENT_CLINIC_OR_DEPARTMENT_OTHER): Payer: Medicare Other | Admitting: Oncology

## 2015-01-25 VITALS — BP 93/61 | HR 68 | Temp 97.3°F | Resp 20 | Wt 149.3 lb

## 2015-01-25 DIAGNOSIS — E785 Hyperlipidemia, unspecified: Secondary | ICD-10-CM

## 2015-01-25 DIAGNOSIS — D6481 Anemia due to antineoplastic chemotherapy: Secondary | ICD-10-CM

## 2015-01-25 DIAGNOSIS — R944 Abnormal results of kidney function studies: Secondary | ICD-10-CM

## 2015-01-25 DIAGNOSIS — F329 Major depressive disorder, single episode, unspecified: Secondary | ICD-10-CM

## 2015-01-25 DIAGNOSIS — I959 Hypotension, unspecified: Secondary | ICD-10-CM

## 2015-01-25 DIAGNOSIS — Z5111 Encounter for antineoplastic chemotherapy: Secondary | ICD-10-CM | POA: Diagnosis not present

## 2015-01-25 DIAGNOSIS — C760 Malignant neoplasm of head, face and neck: Secondary | ICD-10-CM

## 2015-01-25 DIAGNOSIS — M1990 Unspecified osteoarthritis, unspecified site: Secondary | ICD-10-CM

## 2015-01-25 DIAGNOSIS — C01 Malignant neoplasm of base of tongue: Secondary | ICD-10-CM

## 2015-01-25 DIAGNOSIS — Z510 Encounter for antineoplastic radiation therapy: Secondary | ICD-10-CM | POA: Diagnosis not present

## 2015-01-25 DIAGNOSIS — R630 Anorexia: Secondary | ICD-10-CM

## 2015-01-25 DIAGNOSIS — T451X5S Adverse effect of antineoplastic and immunosuppressive drugs, sequela: Secondary | ICD-10-CM

## 2015-01-25 DIAGNOSIS — Z87891 Personal history of nicotine dependence: Secondary | ICD-10-CM

## 2015-01-25 DIAGNOSIS — Z79899 Other long term (current) drug therapy: Secondary | ICD-10-CM

## 2015-01-25 LAB — BASIC METABOLIC PANEL
Anion gap: 7 (ref 5–15)
BUN: 31 mg/dL — AB (ref 6–20)
CALCIUM: 9 mg/dL (ref 8.9–10.3)
CO2: 28 mmol/L (ref 22–32)
Chloride: 99 mmol/L — ABNORMAL LOW (ref 101–111)
Creatinine, Ser: 1.45 mg/dL — ABNORMAL HIGH (ref 0.61–1.24)
GFR calc Af Amer: 60 mL/min — ABNORMAL LOW (ref >60)
GFR calc non Af Amer: 52 mL/min — ABNORMAL LOW (ref >60)
GLUCOSE: 114 mg/dL — AB (ref 65–99)
Potassium: 4 mmol/L (ref 3.5–5.1)
Sodium: 134 mmol/L — ABNORMAL LOW (ref 135–145)

## 2015-01-25 LAB — CBC WITH DIFFERENTIAL/PLATELET
Basophils Absolute: 0 10*3/uL (ref 0–0.1)
Basophils Relative: 1 %
Eosinophils Absolute: 0.1 10*3/uL (ref 0–0.7)
Eosinophils Relative: 2 %
HCT: 32.1 % — ABNORMAL LOW (ref 40.0–52.0)
HEMOGLOBIN: 10.9 g/dL — AB (ref 13.0–18.0)
LYMPHS ABS: 1.2 10*3/uL (ref 1.0–3.6)
LYMPHS PCT: 17 %
MCH: 33.4 pg (ref 26.0–34.0)
MCHC: 33.9 g/dL (ref 32.0–36.0)
MCV: 98.6 fL (ref 80.0–100.0)
MONOS PCT: 11 %
Monocytes Absolute: 0.8 10*3/uL (ref 0.2–1.0)
NEUTROS ABS: 5 10*3/uL (ref 1.4–6.5)
NEUTROS PCT: 69 %
PLATELETS: 277 10*3/uL (ref 150–440)
RBC: 3.26 MIL/uL — AB (ref 4.40–5.90)
RDW: 19.1 % — ABNORMAL HIGH (ref 11.5–14.5)
WBC: 7.2 10*3/uL (ref 3.8–10.6)

## 2015-01-25 MED ORDER — PALONOSETRON HCL INJECTION 0.25 MG/5ML
0.2500 mg | Freq: Once | INTRAVENOUS | Status: AC
  Administered 2015-01-25: 0.25 mg via INTRAVENOUS
  Filled 2015-01-25: qty 5

## 2015-01-25 MED ORDER — HEPARIN SOD (PORK) LOCK FLUSH 100 UNIT/ML IV SOLN
500.0000 [IU] | Freq: Once | INTRAVENOUS | Status: AC | PRN
  Administered 2015-01-25: 500 [IU]
  Filled 2015-01-25: qty 5

## 2015-01-25 MED ORDER — SODIUM CHLORIDE 0.9 % IV SOLN
Freq: Once | INTRAVENOUS | Status: AC
  Administered 2015-01-25: 10:00:00 via INTRAVENOUS
  Filled 2015-01-25: qty 1000

## 2015-01-25 MED ORDER — HEPARIN SOD (PORK) LOCK FLUSH 100 UNIT/ML IV SOLN
500.0000 [IU] | Freq: Once | INTRAVENOUS | Status: DC
Start: 2015-01-25 — End: 2015-01-25

## 2015-01-25 MED ORDER — SODIUM CHLORIDE 0.9 % IJ SOLN
10.0000 mL | INTRAMUSCULAR | Status: DC | PRN
  Filled 2015-01-25: qty 10

## 2015-01-25 MED ORDER — POTASSIUM CHLORIDE 2 MEQ/ML IV SOLN
Freq: Once | INTRAVENOUS | Status: AC
  Administered 2015-01-25: 10:00:00 via INTRAVENOUS
  Filled 2015-01-25: qty 1000

## 2015-01-25 MED ORDER — SODIUM CHLORIDE 0.9 % IV SOLN
Freq: Once | INTRAVENOUS | Status: AC
  Administered 2015-01-25: 12:00:00 via INTRAVENOUS
  Filled 2015-01-25: qty 5

## 2015-01-25 MED ORDER — SODIUM CHLORIDE 0.9 % IV SOLN
20.0000 mg/m2 | Freq: Once | INTRAVENOUS | Status: AC
  Administered 2015-01-25: 37 mg via INTRAVENOUS
  Filled 2015-01-25: qty 37

## 2015-01-26 ENCOUNTER — Ambulatory Visit: Payer: Medicare Other

## 2015-01-26 ENCOUNTER — Ambulatory Visit
Admission: RE | Admit: 2015-01-26 | Discharge: 2015-01-26 | Disposition: A | Discharge status: Home / Self Care | Payer: Medicare Other | Source: Ambulatory Visit | Attending: Radiation Oncology | Admitting: Radiation Oncology

## 2015-01-26 DIAGNOSIS — Z510 Encounter for antineoplastic radiation therapy: Secondary | ICD-10-CM | POA: Diagnosis not present

## 2015-01-29 ENCOUNTER — Ambulatory Visit: Payer: Medicare Other

## 2015-01-29 ENCOUNTER — Ambulatory Visit
Admission: RE | Admit: 2015-01-29 | Discharge: 2015-01-29 | Disposition: A | Discharge status: Home / Self Care | Payer: Medicare Other | Source: Ambulatory Visit | Attending: Radiation Oncology | Admitting: Radiation Oncology

## 2015-01-29 DIAGNOSIS — Z510 Encounter for antineoplastic radiation therapy: Secondary | ICD-10-CM | POA: Diagnosis not present

## 2015-01-30 ENCOUNTER — Other Ambulatory Visit: Payer: Self-pay | Admitting: *Deleted

## 2015-01-30 ENCOUNTER — Ambulatory Visit
Admission: RE | Admit: 2015-01-30 | Discharge: 2015-01-30 | Disposition: A | Discharge status: Home / Self Care | Payer: Medicare Other | Source: Ambulatory Visit | Attending: Radiation Oncology | Admitting: Radiation Oncology

## 2015-01-30 DIAGNOSIS — Z510 Encounter for antineoplastic radiation therapy: Secondary | ICD-10-CM | POA: Diagnosis not present

## 2015-01-30 DIAGNOSIS — C01 Malignant neoplasm of base of tongue: Secondary | ICD-10-CM

## 2015-01-30 MED ORDER — FLUCONAZOLE 100 MG PO TABS: 100.0000 mg | ORAL_TABLET | Freq: Every day | ORAL | Status: DC

## 2015-01-31 ENCOUNTER — Ambulatory Visit: Payer: Medicare Other

## 2015-01-31 ENCOUNTER — Ambulatory Visit
Admission: RE | Admit: 2015-01-31 | Discharge: 2015-01-31 | Disposition: A | Discharge status: Home / Self Care | Payer: Medicare Other | Source: Ambulatory Visit | Attending: Radiation Oncology | Admitting: Radiation Oncology

## 2015-01-31 DIAGNOSIS — Z510 Encounter for antineoplastic radiation therapy: Secondary | ICD-10-CM | POA: Diagnosis not present

## 2015-02-01 ENCOUNTER — Other Ambulatory Visit: Payer: Self-pay | Admitting: *Deleted

## 2015-02-01 ENCOUNTER — Ambulatory Visit
Admission: RE | Admit: 2015-02-01 | Discharge: 2015-02-01 | Disposition: A | Discharge status: Home / Self Care | Payer: Medicare Other | Source: Ambulatory Visit | Attending: Radiation Oncology | Admitting: Radiation Oncology

## 2015-02-01 DIAGNOSIS — Z510 Encounter for antineoplastic radiation therapy: Secondary | ICD-10-CM | POA: Diagnosis not present

## 2015-02-01 NOTE — Progress Notes (Signed)
[place]  [phone] Fax:(336) [phone]  ID: Mathew Robinson OB: Apr 12, 1956  MR#: [id number]  AOZ#:[id number]  Patient Care Team: Emelia Loron, MD as PCP - General (Family Medicine)  CHIEF COMPLAINT:  Chief Complaint  Patient presents with  . Follow-up    head and neck cancer    INTERVAL HISTORY:  Patient returns to clinic today for further evaluation and reconsideration of cycle 6 of weekly cisplatin. His weakness and fatigue has resolved. He has an improved appetite. He does not complain of dysphagia, but has some mild soreness. He denies any fevers. He has no neurologic complaints. He has no chest pain or shortness of breath. He denies any nausea, vomiting, constipation, or diarrhea. He has no urinary complaints. Patient offers no further specific complaints today.   REVIEW OF SYSTEMS:   Review of Systems  Constitutional: Negative for fever and weight loss.  HENT: Negative.   Respiratory: Negative.   Cardiovascular: Negative.   Neurological: Negative for weakness.    As per HPI. Otherwise, a complete review of systems is negatve.  PAST MEDICAL HISTORY: Past Medical History  Diagnosis Date  . Arthritis   . Cancer     HEAD/NECK/TONGUE CANCER  . Colon cancer   . Depression   . Hyperlipidemia   . HTN (hypertension)   . Squamous cell cancer of tongue     PAST SURGICAL HISTORY: Past Surgical History  Procedure Laterality Date  . Colectomy    . Splenectomy      FAMILY HISTORY Family History  Problem Relation Age of Onset  . Hypercholesterolemia      FAMILY HX  . Head & neck cancer Father        ADVANCED DIRECTIVES:    HEALTH MAINTENANCE: Social History  Substance Use Topics  . Smoking status: Former Smoker -- 1.50 packs/day for 20 years    Types: Cigarettes  . Smokeless tobacco: Former Systems developer    Quit date: 03/06/1985     Comment: HEAVY TOBACCO USE. QUIT GREATER THAN 15 YRS AGO  . Alcohol Use: 0.0 oz/week    0  Standard drinks or equivalent per week     Comment: Patient states "very little     Colonoscopy:  PAP:  Bone density:  Lipid panel:  Allergies  Allergen Reactions  . Penicillin G     Other reaction(s): Itching of Skin, Localized superficial swelling of skin  . Penicillins Itching and Swelling  . Tramadol     Other reaction(s): Unknown    Current Outpatient Prescriptions  Medication Sig Dispense Refill  . amLODipine (NORVASC) 10 MG tablet Take 10 mg by mouth daily.    [name] atenolol-chlorthalidone (TENORETIC) 50-25 MG per tablet Take 1 tablet by mouth daily.  5  . cyclobenzaprine (FLEXERIL) 10 MG tablet Take 10 mg by mouth 3 (three) times daily as needed for muscle spasms.    . diazepam (VALIUM) 10 MG tablet Take 10 mg by mouth at bedtime.  0  . FENOFIBRATE PO Take 1 tablet by mouth 1 day or 1 dose.    . gabapentin (NEURONTIN) 600 MG tablet Take 600 mg by mouth 3 (three) times daily.  3  . hydrochlorothiazide (MICROZIDE) 12.5 MG capsule Take 1 capsule by mouth 1 day or 1 dose.    . ibuprofen (ADVIL,MOTRIN) 800 MG tablet Take 1 tablet by mouth 3 (three) times daily as needed. PAIN    . lisinopril (PRINIVIL,ZESTRIL) 40 MG tablet Take 40 mg by mouth daily.  5  .  mirtazapine (REMERON) 15 MG tablet Take 15 mg by mouth at bedtime.    . NON FORMULARY Take 5 mLs by mouth 4 (four) times daily as needed.    . Omega-3 Fatty Acids (FISH OIL) 1000 MG CAPS Take 1 capsule by mouth.    . Oxycodone HCl 10 MG TABS Take 1 tablet (10 mg total) by mouth every 8 (eight) hours as needed. 90 tablet 0  . prochlorperazine (COMPAZINE) 10 MG tablet Take 1 tablet (10 mg total) by mouth every 6 (six) hours as needed for nausea or vomiting. 30 tablet 2  . simvastatin (ZOCOR) 20 MG tablet Take 20 mg by mouth at bedtime.  5  . sucralfate (CARAFATE) 1 G tablet Take 1 tablet (1 g total) by mouth 3 (three) times daily. Dissolve in 2-3 tbsp warm water, swish and swallow. 90 tablet 3  . vitamin C (ASCORBIC ACID) 500 MG  tablet Take by mouth 1 day or 1 dose.    . fluconazole (DIFLUCAN) 100 MG tablet Take 1 tablet (100 mg total) by mouth daily. 3 tablet 0   No current facility-administered medications for this visit.   Facility-Administered Medications Ordered in Other Visits  Medication Dose Route Frequency Provider Last Rate Last Dose  . sodium chloride 0.9 % injection 10 mL  10 mL Intravenous PRN Lloyd Huger, MD   10 mL at 11/03/14 1005    OBJECTIVE: Filed Vitals:   01/25/15 0943  BP: 93/61  Pulse: 68  Temp: 97.3 F (36.3 C)  Resp: 20     Body mass index is 22.06 kg/(m^2).    ECOG FS:0 - Asymptomatic  General: Well-developed, well-nourished, no acute distress. Eyes: anicteric sclera. HEENT: No palpable lymphadenopathy of left neck, mild erythema in oropharynx. Lungs: Clear to auscultation bilaterally. Heart: Regular rate and rhythm. No rubs, murmurs, or gallops. Abdomen: Soft, nontender, nondistended. No organomegaly noted, normoactive bowel sounds. Musculoskeletal: No edema, cyanosis, or clubbing. Neuro: Alert, answering all questions appropriately. Cranial nerves grossly intact. Skin: No rashes or petechiae noted. Psych: Normal affect.    LAB RESULTS:  Lab Results  Component Value Date   NA 134* 01/25/2015   K 4.0 01/25/2015   CL 99* 01/25/2015   CO2 28 01/25/2015   GLUCOSE 114* 01/25/2015   BUN 31* 01/25/2015   CREATININE 1.45* 01/25/2015   CALCIUM 9.0 01/25/2015   PROT 7.2 12/21/2014   ALBUMIN 4.0 12/21/2014   AST 17 12/21/2014   ALT 13* 12/21/2014   ALKPHOS 88 12/21/2014   BILITOT 0.4 12/21/2014   GFRNONAA 52* 01/25/2015   GFRAA 60* 01/25/2015    Lab Results  Component Value Date   WBC 7.2 01/25/2015   NEUTROABS 5.0 01/25/2015   HGB 10.9* 01/25/2015   HCT 32.1* 01/25/2015   MCV 98.6 01/25/2015   PLT 277 01/25/2015     STUDIES: No results found.  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  PLAN:    1. Tongue cancer: Outside  pathology, imaging, and physician notes reviewed independently. Patient completed 3 cycles of induction chemotherapy with Taxotere, cisplatin, and 5-FU. Proceed with cycle 6 of weekly cisplatin 20 mg/m2. Continue daily XRT which will be completed on February 01, 2015. Return to clinic in approximately 6 weeks for his survivorship visit and in 3 months with repeat imaging and further evaluation. Patient will also require a referral back to ENT for further evaluation.  2. Anemia: Secondary chemotherapy, monitor. 3. Hypotension: Patient's appetite has improved, monitor.  4. Elevated creatinine:  Patient will receive IV fluids along with his cisplatin. 5. Mild oropharyngeal tenderness: No intervention needed, consider Carafate in the near future.   Patient expressed understanding and was in agreement with this plan. He also understands that He can call clinic at any time with any questions, concerns, or complaints.   Malignant neoplasm of base of tongue   Staging form: Lip and Oral Cavity, AJCC 7th Edition     Clinical stage from 10/18/2014: Stage IVA (T2, N2, M0) - Signed by Lloyd Huger, MD on 10/18/2014   Lloyd Huger, MD   02/01/2015 8:58 AM

## 2015-02-08 ENCOUNTER — Telehealth: Payer: Self-pay | Admitting: *Deleted

## 2015-02-08 NOTE — Telephone Encounter (Signed)
Patient has follow up appointment with Dr. Cletis Athens at [facility] on 91/ @ 9:30 am, appointment details given to patients sister Horris Latino, she verbalized understanding of appointment details.

## 2015-03-12 ENCOUNTER — Ambulatory Visit: Payer: Medicare Other | Admitting: Radiation Oncology

## 2015-03-13 ENCOUNTER — Inpatient Hospital Stay: Payer: Medicare Other | Attending: Oncology

## 2015-03-13 ENCOUNTER — Ambulatory Visit
Admission: RE | Admit: 2015-03-13 | Discharge: 2015-03-13 | Disposition: A | Discharge status: Home / Self Care | Payer: Medicare Other | Source: Ambulatory Visit | Attending: Radiation Oncology | Admitting: Radiation Oncology

## 2015-03-13 ENCOUNTER — Inpatient Hospital Stay: Payer: Medicare Other

## 2015-03-13 VITALS — BP 120/82 | HR 98 | Temp 96.9°F | Resp 18 | Wt 143.8 lb

## 2015-03-13 DIAGNOSIS — D6481 Anemia due to antineoplastic chemotherapy: Secondary | ICD-10-CM | POA: Insufficient documentation

## 2015-03-13 DIAGNOSIS — C01 Malignant neoplasm of base of tongue: Secondary | ICD-10-CM | POA: Insufficient documentation

## 2015-03-13 DIAGNOSIS — T451X5S Adverse effect of antineoplastic and immunosuppressive drugs, sequela: Secondary | ICD-10-CM | POA: Diagnosis not present

## 2015-03-13 DIAGNOSIS — C801 Malignant (primary) neoplasm, unspecified: Secondary | ICD-10-CM

## 2015-03-13 MED ORDER — HEPARIN SOD (PORK) LOCK FLUSH 100 UNIT/ML IV SOLN
500.0000 [IU] | Freq: Once | INTRAVENOUS | Status: AC
  Administered 2015-03-13: 500 [IU] via INTRAVENOUS

## 2015-03-13 MED ORDER — SODIUM CHLORIDE 0.9 % IJ SOLN
10.0000 mL | Freq: Once | INTRAMUSCULAR | Status: AC
  Administered 2015-03-13: 10 mL via INTRAVENOUS
  Filled 2015-03-13: qty 10

## 2015-03-13 MED ORDER — HEPARIN SOD (PORK) LOCK FLUSH 100 UNIT/ML IV SOLN
INTRAVENOUS | Status: AC
  Filled 2015-03-13: qty 5

## 2015-04-05 ENCOUNTER — Emergency Department (HOSPITAL_COMMUNITY): Payer: Medicare Other

## 2015-04-05 ENCOUNTER — Emergency Department (HOSPITAL_COMMUNITY)
Admission: EM | Admit: 2015-04-05 | Discharge: 2015-04-05 | Disposition: A | Discharge status: Home / Self Care | Payer: Medicare Other | Attending: Emergency Medicine | Admitting: Emergency Medicine

## 2015-04-05 ENCOUNTER — Telehealth: Payer: Self-pay | Admitting: *Deleted

## 2015-04-05 ENCOUNTER — Encounter (HOSPITAL_COMMUNITY): Payer: Self-pay

## 2015-04-05 DIAGNOSIS — R609 Edema, unspecified: Secondary | ICD-10-CM | POA: Insufficient documentation

## 2015-04-05 DIAGNOSIS — Z87891 Personal history of nicotine dependence: Secondary | ICD-10-CM | POA: Diagnosis not present

## 2015-04-05 DIAGNOSIS — F329 Major depressive disorder, single episode, unspecified: Secondary | ICD-10-CM | POA: Insufficient documentation

## 2015-04-05 DIAGNOSIS — M1990 Unspecified osteoarthritis, unspecified site: Secondary | ICD-10-CM | POA: Insufficient documentation

## 2015-04-05 DIAGNOSIS — Z8589 Personal history of malignant neoplasm of other organs and systems: Secondary | ICD-10-CM | POA: Insufficient documentation

## 2015-04-05 DIAGNOSIS — Z880 Allergy status to penicillin: Secondary | ICD-10-CM | POA: Insufficient documentation

## 2015-04-05 DIAGNOSIS — I10 Essential (primary) hypertension: Secondary | ICD-10-CM | POA: Insufficient documentation

## 2015-04-05 DIAGNOSIS — Z79899 Other long term (current) drug therapy: Secondary | ICD-10-CM | POA: Insufficient documentation

## 2015-04-05 DIAGNOSIS — Z87820 Personal history of traumatic brain injury: Secondary | ICD-10-CM | POA: Insufficient documentation

## 2015-04-05 DIAGNOSIS — R4182 Altered mental status, unspecified: Secondary | ICD-10-CM | POA: Diagnosis not present

## 2015-04-05 DIAGNOSIS — E785 Hyperlipidemia, unspecified: Secondary | ICD-10-CM | POA: Diagnosis not present

## 2015-04-05 DIAGNOSIS — Z85038 Personal history of other malignant neoplasm of large intestine: Secondary | ICD-10-CM | POA: Diagnosis not present

## 2015-04-05 DIAGNOSIS — Z85828 Personal history of other malignant neoplasm of skin: Secondary | ICD-10-CM | POA: Insufficient documentation

## 2015-04-05 DIAGNOSIS — R930 Abnormal findings on diagnostic imaging of skull and head, not elsewhere classified: Secondary | ICD-10-CM

## 2015-04-05 LAB — CBC
HCT: 32.3 % — ABNORMAL LOW (ref 39.0–52.0)
HEMOGLOBIN: 10.8 g/dL — AB (ref 13.0–17.0)
MCH: 32.3 pg (ref 26.0–34.0)
MCHC: 33.4 g/dL (ref 30.0–36.0)
MCV: 96.7 fL (ref 78.0–100.0)
PLATELETS: 300 10*3/uL (ref 150–400)
RBC: 3.34 MIL/uL — ABNORMAL LOW (ref 4.22–5.81)
RDW: 15.1 % (ref 11.5–15.5)
WBC: 14.6 10*3/uL — ABNORMAL HIGH (ref 4.0–10.5)

## 2015-04-05 LAB — TROPONIN I: Troponin I: <0.03 ng/mL (ref <0.031)

## 2015-04-05 LAB — CBG MONITORING, ED: GLUCOSE-CAPILLARY: 99 mg/dL (ref 65–99)

## 2015-04-05 LAB — URINALYSIS, ROUTINE W REFLEX MICROSCOPIC
BILIRUBIN URINE: NEGATIVE
GLUCOSE, UA: NEGATIVE mg/dL
HGB URINE DIPSTICK: NEGATIVE
Ketones, ur: NEGATIVE mg/dL
Leukocytes, UA: NEGATIVE
Nitrite: NEGATIVE
PH: 5 (ref 5.0–8.0)
Protein, ur: NEGATIVE mg/dL
SPECIFIC GRAVITY, URINE: 1.012 (ref 1.005–1.030)
Urobilinogen, UA: 0.2 mg/dL (ref 0.0–1.0)

## 2015-04-05 LAB — COMPREHENSIVE METABOLIC PANEL
ALBUMIN: 4 g/dL (ref 3.5–5.0)
ALK PHOS: 61 U/L (ref 38–126)
ALT: 9 U/L — AB (ref 17–63)
ANION GAP: 10 (ref 5–15)
AST: 17 U/L (ref 15–41)
BUN: 31 mg/dL — ABNORMAL HIGH (ref 6–20)
CALCIUM: 10.2 mg/dL (ref 8.9–10.3)
CO2: 26 mmol/L (ref 22–32)
CREATININE: 1.45 mg/dL — AB (ref 0.61–1.24)
Chloride: 100 mmol/L — ABNORMAL LOW (ref 101–111)
GFR calc Af Amer: 60 mL/min — ABNORMAL LOW (ref >60)
GFR calc non Af Amer: 52 mL/min — ABNORMAL LOW (ref >60)
GLUCOSE: 97 mg/dL (ref 65–99)
Potassium: 4.8 mmol/L (ref 3.5–5.1)
SODIUM: 136 mmol/L (ref 135–145)
Total Bilirubin: 0.8 mg/dL (ref 0.3–1.2)
Total Protein: 6.7 g/dL (ref 6.5–8.1)

## 2015-04-05 LAB — I-STAT CG4 LACTIC ACID, ED
Lactic Acid, Venous: 0.73 mmol/L (ref 0.5–2.0)
Lactic Acid, Venous: 0.41 mmol/L — ABNORMAL LOW (ref 0.5–2.0)

## 2015-04-05 MED ORDER — SODIUM CHLORIDE 0.9 % IV SOLN: Freq: Once | INTRAVENOUS | Status: DC

## 2015-04-05 MED ORDER — GADOBENATE DIMEGLUMINE 529 MG/ML IV SOLN
15.0000 mL | Freq: Once | INTRAVENOUS | Status: AC | PRN
  Administered 2015-04-05: 14 mL via INTRAVENOUS

## 2015-04-05 MED ORDER — SODIUM CHLORIDE 0.9 % IV SOLN
Freq: Once | INTRAVENOUS | Status: AC
  Administered 2015-04-05: 08:00:00 via INTRAVENOUS

## 2015-04-05 MED ORDER — SODIUM CHLORIDE 0.9 % IV BOLUS (SEPSIS)
1000.0000 mL | Freq: Once | INTRAVENOUS | Status: AC
Start: 2015-04-05 — End: 2015-04-05
  Administered 2015-04-05: 1000 mL via INTRAVENOUS

## 2015-04-05 NOTE — ED Notes (Signed)
Bed: YB33 Expected date:  Expected time:  Means of arrival:  Comments: 1M AMS ?roxicodone
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
## 2015-04-05 NOTE — Telephone Encounter (Signed)
Patients sister called to inform Dr. Grayland Ormond that patient was taken to the ER at [facility] last night due to confusion. CT scan was performed as well as MRI which appeared to show suspicious area on the brain. Family is concerned and asking if his PET scan or follow up appointment with Dr. Grayland Ormond should be moved up. His next PET scan and appointment are in November.

## 2015-04-05 NOTE — ED Notes (Signed)
Per EMS pt was found outside going through his neighbors barn. Pt reports taking 2 Roxicodone and states he was told to watch his neighbors car. Pt alert and oriented to self. Per EMS pt wanting his sister called and states, "she's going to be upset with me."

## 2015-04-05 NOTE — Discharge Instructions (Signed)
Mathew Robinson,   It was nice meeting you and your brother. Please follow-up with your primary care provider regarding your slightly elevated white blood cell count, and medication management. Your oncologist has a PET/CT scan scheduled in November- please follow-up with that. Feel better soon!  S. Wendie Simmer, PA-C

## 2015-04-05 NOTE — ED Notes (Signed)
Patient transported to MRI 

## 2015-04-05 NOTE — Telephone Encounter (Signed)
Ct and mri looked ok from a malignancy stand point.  No obvious metastatic disease.  Although I can see him sooner if they wish.

## 2015-04-06 NOTE — Telephone Encounter (Signed)
Left message for patients family.

## 2015-04-27 ENCOUNTER — Ambulatory Visit
Admission: RE | Admit: 2015-04-27 | Discharge: 2015-04-27 | Disposition: A | Discharge status: Home / Self Care | Payer: Medicare Other | Source: Ambulatory Visit | Attending: Oncology | Admitting: Oncology

## 2015-04-27 DIAGNOSIS — R911 Solitary pulmonary nodule: Secondary | ICD-10-CM | POA: Insufficient documentation

## 2015-04-27 DIAGNOSIS — C760 Malignant neoplasm of head, face and neck: Secondary | ICD-10-CM | POA: Diagnosis not present

## 2015-04-27 DIAGNOSIS — J439 Emphysema, unspecified: Secondary | ICD-10-CM | POA: Insufficient documentation

## 2015-04-27 DIAGNOSIS — I708 Atherosclerosis of other arteries: Secondary | ICD-10-CM | POA: Insufficient documentation

## 2015-04-27 DIAGNOSIS — J320 Chronic maxillary sinusitis: Secondary | ICD-10-CM | POA: Insufficient documentation

## 2015-04-27 LAB — GLUCOSE, CAPILLARY: Glucose-Capillary: 71 mg/dL (ref 65–99)

## 2015-04-27 MED ORDER — FLUDEOXYGLUCOSE F - 18 (FDG) INJECTION
12.1900 | Freq: Once | INTRAVENOUS | Status: DC | PRN
  Administered 2015-04-27: 12.19 via INTRAVENOUS
  Filled 2015-04-27: qty 12.19

## 2015-04-30 ENCOUNTER — Inpatient Hospital Stay: Payer: Medicare Other

## 2015-04-30 ENCOUNTER — Inpatient Hospital Stay: Payer: Medicare Other | Attending: Oncology | Admitting: Oncology

## 2015-04-30 ENCOUNTER — Other Ambulatory Visit: Payer: Medicare Other

## 2015-04-30 VITALS — BP 120/83 | HR 60 | Temp 97.0°F | Resp 20 | Wt 148.4 lb

## 2015-04-30 DIAGNOSIS — D649 Anemia, unspecified: Secondary | ICD-10-CM

## 2015-04-30 DIAGNOSIS — Z79899 Other long term (current) drug therapy: Secondary | ICD-10-CM

## 2015-04-30 DIAGNOSIS — I10 Essential (primary) hypertension: Secondary | ICD-10-CM | POA: Insufficient documentation

## 2015-04-30 DIAGNOSIS — C760 Malignant neoplasm of head, face and neck: Secondary | ICD-10-CM

## 2015-04-30 DIAGNOSIS — Z85038 Personal history of other malignant neoplasm of large intestine: Secondary | ICD-10-CM | POA: Diagnosis not present

## 2015-04-30 DIAGNOSIS — Z452 Encounter for adjustment and management of vascular access device: Secondary | ICD-10-CM | POA: Diagnosis not present

## 2015-04-30 DIAGNOSIS — E785 Hyperlipidemia, unspecified: Secondary | ICD-10-CM | POA: Diagnosis not present

## 2015-04-30 DIAGNOSIS — Z87891 Personal history of nicotine dependence: Secondary | ICD-10-CM | POA: Diagnosis not present

## 2015-04-30 DIAGNOSIS — C01 Malignant neoplasm of base of tongue: Secondary | ICD-10-CM

## 2015-04-30 LAB — TSH: TSH: 1.526 u[IU]/mL (ref 0.350–4.500)

## 2015-04-30 LAB — BASIC METABOLIC PANEL
Anion gap: 9 (ref 5–15)
BUN: 15 mg/dL (ref 6–20)
CHLORIDE: 98 mmol/L — AB (ref 101–111)
CO2: 27 mmol/L (ref 22–32)
CREATININE: 1.03 mg/dL (ref 0.61–1.24)
Calcium: 9.1 mg/dL (ref 8.9–10.3)
GFR calc Af Amer: >60 mL/min (ref >60)
GFR calc non Af Amer: >60 mL/min (ref >60)
GLUCOSE: 105 mg/dL — AB (ref 65–99)
POTASSIUM: 3.7 mmol/L (ref 3.5–5.1)
Sodium: 134 mmol/L — ABNORMAL LOW (ref 135–145)

## 2015-04-30 LAB — CBC WITH DIFFERENTIAL/PLATELET
Basophils Absolute: 0.1 10*3/uL (ref 0–0.1)
Basophils Relative: 1 %
EOS ABS: 0.1 10*3/uL (ref 0–0.7)
Eosinophils Relative: 2 %
HEMATOCRIT: 35.7 % — AB (ref 40.0–52.0)
HEMOGLOBIN: 12.2 g/dL — AB (ref 13.0–18.0)
LYMPHS ABS: 1.7 10*3/uL (ref 1.0–3.6)
LYMPHS PCT: 19 %
MCH: 32.5 pg (ref 26.0–34.0)
MCHC: 34.1 g/dL (ref 32.0–36.0)
MCV: 95.2 fL (ref 80.0–100.0)
MONOS PCT: 12 %
Monocytes Absolute: 1 10*3/uL (ref 0.2–1.0)
NEUTROS PCT: 66 %
Neutro Abs: 6 10*3/uL (ref 1.4–6.5)
Platelets: 241 10*3/uL (ref 150–440)
RBC: 3.75 MIL/uL — AB (ref 4.40–5.90)
RDW: 14.6 % — ABNORMAL HIGH (ref 11.5–14.5)
WBC: 8.9 10*3/uL (ref 3.8–10.6)

## 2015-04-30 MED ORDER — SODIUM CHLORIDE 0.9 % IJ SOLN
10.0000 mL | INTRAMUSCULAR | Status: DC | PRN
  Administered 2015-04-30: 10 mL via INTRAVENOUS
  Filled 2015-04-30: qty 10

## 2015-04-30 MED ORDER — HEPARIN SOD (PORK) LOCK FLUSH 100 UNIT/ML IV SOLN
500.0000 [IU] | Freq: Once | INTRAVENOUS | Status: AC
  Administered 2015-04-30: 500 [IU] via INTRAVENOUS
  Filled 2015-04-30: qty 5

## 2015-04-30 NOTE — Progress Notes (Signed)
Patient here today for follow up regarding head and neck cancer, PET results. Patient denies any concerns today.

## 2015-05-01 LAB — T4: T4, Total: 8.1 ug/dL (ref 4.5–12.0)

## 2015-05-15 NOTE — Progress Notes (Signed)
[place]  Telephone:(336) [phone] Fax:(336) [phone]  ID: Bonnita Levan OB: [birth year]  MR#: [id number]  [id number]  Patient Care Team: Emelia Loron, MD as PCP - General (Family Medicine)  CHIEF COMPLAINT:  Chief Complaint  Patient presents with  . head and neck cancer    follow up    INTERVAL HISTORY:  Patient returns to clinic today for further evaluation and discussion of his PET scan results. He currently feels well and is asymptomatic. He has a good appetite and is gaining weight. He does not complain of dysphagia today. He denies any fevers. He has no neurologic complaints. He has no chest pain or shortness of breath. He denies any nausea, vomiting, constipation, or diarrhea. He has no urinary complaints. Patient offers no specific complaints today.   REVIEW OF SYSTEMS:   Review of Systems  Constitutional: Negative for fever and weight loss.  HENT: Negative.   Respiratory: Negative.   Cardiovascular: Negative.   Musculoskeletal: Negative.   Neurological: Negative.  Negative for weakness.    As per HPI. Otherwise, a complete review of systems is negatve.  PAST MEDICAL HISTORY: Past Medical History  Diagnosis Date  . Arthritis   . Cancer [facility])     HEAD/NECK/TONGUE CANCER  . Colon cancer ([place])   . Depression   . Hyperlipidemia   . HTN (hypertension)   . Squamous cell cancer of tongue ([place])     PAST SURGICAL HISTORY: Past Surgical History  Procedure Laterality Date  . Colectomy    . Splenectomy      FAMILY HISTORY Family History  Problem Relation Age of Onset  . Hypercholesterolemia      FAMILY HX  . Head & neck cancer Father        ADVANCED DIRECTIVES:    HEALTH MAINTENANCE: Social History  Substance Use Topics  . Smoking status: Former Smoker -- 1.50 packs/day for 20 years    Types: Cigarettes  . Smokeless tobacco: Former Systems developer    Quit date: 03/06/1985     Comment: HEAVY TOBACCO USE. QUIT GREATER THAN 15 YRS AGO    . Alcohol Use: 0.0 oz/week    0 Standard drinks or equivalent per week     Comment: Patient states "very little     Colonoscopy:  PAP:  Bone density:  Lipid panel:  Allergies  Allergen Reactions  . Penicillin G     Other reaction(s): Itching of Skin, Localized superficial swelling of skin  . Penicillins Itching and Swelling  . Tramadol     Other reaction(s): Unknown    Current Outpatient Prescriptions  Medication Sig Dispense Refill  . amLODipine (NORVASC) 10 MG tablet Take 10 mg by mouth daily.    [name] atenolol-chlorthalidone (TENORETIC) 50-25 MG per tablet Take 1 tablet by mouth daily.  5  . cyclobenzaprine (FLEXERIL) 10 MG tablet Take 10 mg by mouth 3 (three) times daily as needed for muscle spasms.    . diazepam (VALIUM) 10 MG tablet Take 10 mg by mouth at bedtime.  0  . FENOFIBRATE PO Take 1 tablet by mouth 1 day or 1 dose.    . gabapentin (NEURONTIN) 600 MG tablet Take 600 mg by mouth 3 (three) times daily.  3  . hydrochlorothiazide (MICROZIDE) 12.5 MG capsule Take 1 capsule by mouth 1 day or 1 dose.    . ibuprofen (ADVIL,MOTRIN) 800 MG tablet Take 1 tablet by mouth 3 (three) times daily as needed. PAIN    . lisinopril (PRINIVIL,ZESTRIL) 40 MG tablet  Take 40 mg by mouth daily.  5  . mirtazapine (REMERON) 15 MG tablet Take 15 mg by mouth at bedtime.    . NON FORMULARY Take 5 mLs by mouth 4 (four) times daily as needed.    . Omega-3 Fatty Acids (FISH OIL) 1000 MG CAPS Take 1 capsule by mouth.    . Oxycodone HCl 10 MG TABS Take 1 tablet (10 mg total) by mouth every 8 (eight) hours as needed. (Patient taking differently: Take 10 mg by mouth every 8 (eight) hours as needed (pain). ) 90 tablet 0  . vitamin C (ASCORBIC ACID) 500 MG tablet Take by mouth 1 day or 1 dose.    . fluconazole (DIFLUCAN) 100 MG tablet Take 1 tablet (100 mg total) by mouth daily. (Patient not taking: Reported on 04/30/2015) 3 tablet 0  . prochlorperazine (COMPAZINE) 10 MG tablet Take 1 tablet (10 mg total)  by mouth every 6 (six) hours as needed for nausea or vomiting. 30 tablet 2  . simvastatin (ZOCOR) 20 MG tablet Take 20 mg by mouth at bedtime.  5  . sucralfate (CARAFATE) 1 G tablet Take 1 tablet (1 g total) by mouth 3 (three) times daily. Dissolve in 2-3 tbsp warm water, swish and swallow. (Patient not taking: Reported on 04/30/2015) 90 tablet 3   Current Facility-Administered Medications  Medication Dose Route Frequency Provider Last Rate Last Dose  . sodium chloride 0.9 % injection 10 mL  10 mL Intravenous PRN Lloyd Huger, MD   10 mL at 04/30/15 [id number]   Facility-Administered Medications Ordered in Other Visits  Medication Dose Route Frequency Provider Last Rate Last Dose  . sodium chloride 0.9 % injection 10 mL  10 mL Intravenous PRN Lloyd Huger, MD   10 mL at 11/03/14 1005    OBJECTIVE: Filed Vitals:   04/30/15 1114  BP: 120/83  Pulse: 60  Temp: 97 F (36.1 C)  Resp: 20     Body mass index is 21.9 kg/(m^2).    ECOG FS:0 - Asymptomatic  General: Well-developed, well-nourished, no acute distress. Eyes: anicteric sclera. HEENT: No palpable lymphadenopathy of left neck, oropharynx without erythema or exudate. Lungs: Clear to auscultation bilaterally. Heart: Regular rate and rhythm. No rubs, murmurs, or gallops. Abdomen: Soft, nontender, nondistended. No organomegaly noted, normoactive bowel sounds. Musculoskeletal: No edema, cyanosis, or clubbing. Neuro: Alert, answering all questions appropriately. Cranial nerves grossly intact. Skin: No rashes or petechiae noted. Psych: Normal affect.    LAB RESULTS:  Lab Results  Component Value Date   NA 134* 04/30/2015   K 3.7 04/30/2015   CL 98* 04/30/2015   CO2 27 04/30/2015   GLUCOSE 105* 04/30/2015   BUN 15 04/30/2015   CREATININE 1.03 04/30/2015   CALCIUM 9.1 04/30/2015   PROT 6.7 04/05/2015   ALBUMIN 4.0 04/05/2015   AST 17 04/05/2015   ALT 9* 04/05/2015   ALKPHOS 61 04/05/2015   BILITOT 0.8 04/05/2015    GFRNONAA >60 04/30/2015   GFRAA >60 04/30/2015    Lab Results  Component Value Date   WBC 8.9 04/30/2015   NEUTROABS 6.0 04/30/2015   HGB 12.2* 04/30/2015   HCT 35.7* 04/30/2015   MCV 95.2 04/30/2015   PLT 241 04/30/2015     STUDIES: Nm Pet Image Restag (ps) Skull Base To Thigh  04/27/2015  CLINICAL DATA:  Subsequent treatment strategy for metastatic squamous cell head and neck cancer likely arising from the left vallecular region. EXAM: NUCLEAR MEDICINE PET SKULL BASE TO THIGH TECHNIQUE:  12.2 mCi F-18 FDG was injected intravenously. Full-ring PET imaging was performed from the skull base to thigh after the radiotracer. CT data was obtained and used for attenuation correction and anatomic localization. 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 Chronic bilateral maxillary sinusitis. Currently no adenopathy or hypermetabolic activity in the neck. CHEST Paraseptal emphysema. Mucus plugging in the left lower lobe. Superior segment left lower lobe nodule with marginal calcifications measuring 10 by 9 mm, stable in size, not hypermetabolic, maximum standard uptake value in this vicinity 1.2. ABDOMEN/PELVIS No abnormal hypermetabolic activity within the liver, pancreas, adrenal glands, or spleen. No hypermetabolic lymph nodes in the abdomen or pelvis. Prominent stool throughout the colon favors constipation. Aortoiliac 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 activity accounting for several left upper quadrant nodules. SKELETON Old healed left posterior rib fractures. IMPRESSION: 1. No residual or recurrent hypermetabolic activity to suggest malignancy. 2. 10 mm stable superior segment left lower lobe nodule is not hypermetabolic at all, and probably related to the scattered mucus plugging noted in the left lung. 3.  Prominent stool  throughout the colon favors constipation. 4. 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 Paraseptal emphysema. Aortoiliac atherosclerotic vascular disease. Electronically Signed   By: Van Clines M.D.   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  PLAN:    1. Tongue cancer: PET scan results reviewed independently and reported as above with no obvious evidence of disease. Patient completed his chemotherapy and XRT on approximately February 01, 2015. No further intervention is needed at this time. Patient will require referral by ENT in the near future. Return to clinic in 3 months for further evaluation. No further imaging is necessary unless there is suspicion of recurrence.  2. Anemia: Improving, monitor.   Patient expressed understanding and was in agreement with this plan. He also understands that He can call clinic at any time with any questions, concerns, or complaints.   Malignant neoplasm of base of tongue   Staging form: Lip and Oral Cavity, AJCC 7th Edition     Clinical stage from 10/18/2014: Stage IVA (T2, N2, M0) - Signed by Lloyd Huger, MD on 10/18/2014   Lloyd Huger, MD   05/15/2015 1:19 PM

## 2015-06-14 ENCOUNTER — Inpatient Hospital Stay: Payer: Medicare Other

## 2015-06-14 ENCOUNTER — Inpatient Hospital Stay: Payer: Medicare Other | Attending: Oncology

## 2015-06-14 DIAGNOSIS — Z9221 Personal history of antineoplastic chemotherapy: Secondary | ICD-10-CM | POA: Insufficient documentation

## 2015-06-14 DIAGNOSIS — C001 Malignant neoplasm of external lower lip: Secondary | ICD-10-CM | POA: Insufficient documentation

## 2015-06-14 DIAGNOSIS — C801 Malignant (primary) neoplasm, unspecified: Secondary | ICD-10-CM

## 2015-06-14 DIAGNOSIS — Z452 Encounter for adjustment and management of vascular access device: Secondary | ICD-10-CM | POA: Diagnosis not present

## 2015-06-14 DIAGNOSIS — C01 Malignant neoplasm of base of tongue: Secondary | ICD-10-CM | POA: Diagnosis present

## 2015-06-14 MED ORDER — SODIUM CHLORIDE 0.9 % IJ SOLN
10.0000 mL | INTRAMUSCULAR | Status: DC | PRN
  Administered 2015-06-14: 10 mL via INTRAVENOUS
  Filled 2015-06-14: qty 10

## 2015-06-14 MED ORDER — HEPARIN SOD (PORK) LOCK FLUSH 100 UNIT/ML IV SOLN
INTRAVENOUS | Status: AC
  Filled 2015-06-14: qty 5

## 2015-06-14 MED ORDER — HEPARIN SOD (PORK) LOCK FLUSH 100 UNIT/ML IV SOLN
500.0000 [IU] | Freq: Once | INTRAVENOUS | Status: AC
  Administered 2015-06-14: 500 [IU] via INTRAVENOUS

## 2015-06-14 NOTE — Progress Notes (Signed)
Survivorship Care Plan visit completed.  Treatment summary reviewed with patient and brother.  ASCO answers booklet reviewed and given to patient.  CARE program and Cancer Transitions discussed with patient along with other resources provided by the cancer center.

## 2015-07-25 ENCOUNTER — Ambulatory Visit
Admission: RE | Admit: 2015-07-25 | Discharge: 2015-07-25 | Disposition: A | Discharge status: Home / Self Care | Payer: Medicare Other | Source: Ambulatory Visit | Attending: Radiation Oncology | Admitting: Radiation Oncology

## 2015-07-25 ENCOUNTER — Encounter: Payer: Self-pay | Admitting: Radiation Oncology

## 2015-07-25 VITALS — BP 134/83 | HR 99 | Temp 96.9°F | Resp 20 | Wt 158.7 lb

## 2015-07-25 DIAGNOSIS — C01 Malignant neoplasm of base of tongue: Secondary | ICD-10-CM

## 2015-07-25 NOTE — Progress Notes (Signed)
Radiation Oncology Follow up Note  Name: Mathew Robinson   Date:   07/25/2015 MRN:  [id number] DOB: April 07, 1956    This 60 y.o. male presents to the clinic today for follow-up for stage III squamous cell carcinoma the left tongue base (T2 N2 B M0).  REFERRING PROVIDER: Emelia Loron, MD  HPI: Patient is a 60 year old male now 5 months out having completed concomitant chemoradiation for a stage IIIa squamous cell carcinoma the left tongue base. He underwent induction chemotherapy followed by concurrent chemoradiation is now seen out 5 months and is doing well.. He had a repeat PET CT scan back in November show no evidence of residual hypermetabolic activity. He has no dysphagia or head and neck pain. He continues close follow-up care with ENT and has had according to the patient a complete response.  COMPLICATIONS OF TREATMENT: none  FOLLOW UP COMPLIANCE: keeps appointments   PHYSICAL EXAM:  BP 134/83 mmHg  Pulse 99  Temp(Src) 96.9 F (36.1 C)  Resp 20  Wt 158 lb 11.7 oz (72 kg) Oral cavity is clear no oral mucosal lesions are identified. Indirect mirror examination shows upper airway clear vallecula and base of tongue within normal limits. Neck is clear without evidence of subject gastric cervical or supraclavicular adenopathy. Well-developed well-nourished patient in NAD. HEENT reveals PERLA, EOMI, discs not visualized.  Oral cavity is clear. No oral mucosal lesions are identified. Neck is clear without evidence of cervical or supraclavicular adenopathy. Lungs are clear to A&P. Cardiac examination is essentially unremarkable with regular rate and rhythm without murmur rub or thrill. Abdomen is benign with no organomegaly or masses noted. Motor sensory and DTR levels are equal and symmetric in the upper and lower extremities. Cranial nerves II through XII are grossly intact. Proprioception is intact. No peripheral adenopathy or edema is identified. No motor or sensory levels are noted. Crude  visual fields are within normal range.  RADIOLOGY RESULTS: Prior PET/CT scan is reviewed compatible with the above-stated findings  PLAN: Present time he is doing well 5 months out with no evidence of disease. I'm please was overall progress. I've asked to see him back in 6 months for follow-up. He continues 3 month follow-up visits with ENT. Patient knows to call with any concerns.  I would like to take this opportunity for allowing me to participate in the care of your patient.Armstead Peaks., MD

## 2015-07-30 ENCOUNTER — Inpatient Hospital Stay: Payer: Medicare Other | Attending: Oncology

## 2015-07-30 ENCOUNTER — Inpatient Hospital Stay (HOSPITAL_BASED_OUTPATIENT_CLINIC_OR_DEPARTMENT_OTHER): Payer: Medicare Other | Admitting: Oncology

## 2015-07-30 VITALS — BP 124/78 | HR 89 | Temp 98.2°F | Resp 16 | Wt 156.5 lb

## 2015-07-30 DIAGNOSIS — Z9221 Personal history of antineoplastic chemotherapy: Secondary | ICD-10-CM

## 2015-07-30 DIAGNOSIS — D649 Anemia, unspecified: Secondary | ICD-10-CM

## 2015-07-30 DIAGNOSIS — F329 Major depressive disorder, single episode, unspecified: Secondary | ICD-10-CM | POA: Insufficient documentation

## 2015-07-30 DIAGNOSIS — Z85038 Personal history of other malignant neoplasm of large intestine: Secondary | ICD-10-CM

## 2015-07-30 DIAGNOSIS — Z87891 Personal history of nicotine dependence: Secondary | ICD-10-CM

## 2015-07-30 DIAGNOSIS — M1990 Unspecified osteoarthritis, unspecified site: Secondary | ICD-10-CM | POA: Diagnosis not present

## 2015-07-30 DIAGNOSIS — Z923 Personal history of irradiation: Secondary | ICD-10-CM

## 2015-07-30 DIAGNOSIS — IMO0002 Reserved for concepts with insufficient information to code with codable children: Secondary | ICD-10-CM

## 2015-07-30 DIAGNOSIS — Z85810 Personal history of malignant neoplasm of tongue: Secondary | ICD-10-CM | POA: Insufficient documentation

## 2015-07-30 DIAGNOSIS — Z79899 Other long term (current) drug therapy: Secondary | ICD-10-CM

## 2015-07-30 DIAGNOSIS — E785 Hyperlipidemia, unspecified: Secondary | ICD-10-CM

## 2015-07-30 DIAGNOSIS — C01 Malignant neoplasm of base of tongue: Secondary | ICD-10-CM

## 2015-07-30 DIAGNOSIS — I10 Essential (primary) hypertension: Secondary | ICD-10-CM | POA: Diagnosis not present

## 2015-07-30 DIAGNOSIS — N289 Disorder of kidney and ureter, unspecified: Secondary | ICD-10-CM | POA: Insufficient documentation

## 2015-07-30 DIAGNOSIS — Z880 Allergy status to penicillin: Secondary | ICD-10-CM | POA: Diagnosis not present

## 2015-07-30 LAB — BASIC METABOLIC PANEL
ANION GAP: 4 — AB (ref 5–15)
BUN: 34 mg/dL — ABNORMAL HIGH (ref 6–20)
CO2: 28 mmol/L (ref 22–32)
Calcium: 9 mg/dL (ref 8.9–10.3)
Chloride: 105 mmol/L (ref 101–111)
Creatinine, Ser: 1.41 mg/dL — ABNORMAL HIGH (ref 0.61–1.24)
GFR calc Af Amer: >60 mL/min (ref >60)
GFR calc non Af Amer: 53 mL/min — ABNORMAL LOW (ref >60)
GLUCOSE: 86 mg/dL (ref 65–99)
POTASSIUM: 4.1 mmol/L (ref 3.5–5.1)
Sodium: 137 mmol/L (ref 135–145)

## 2015-07-30 LAB — CBC WITH DIFFERENTIAL/PLATELET
BASOS ABS: 0 10*3/uL (ref 0–0.1)
Basophils Relative: 1 %
Eosinophils Absolute: 0.1 10*3/uL (ref 0–0.7)
Eosinophils Relative: 1 %
HEMATOCRIT: 36.1 % — AB (ref 40.0–52.0)
Hemoglobin: 12.4 g/dL — ABNORMAL LOW (ref 13.0–18.0)
LYMPHS ABS: 1.3 10*3/uL (ref 1.0–3.6)
LYMPHS PCT: 17 %
MCH: 32.2 pg (ref 26.0–34.0)
MCHC: 34.4 g/dL (ref 32.0–36.0)
MCV: 93.6 fL (ref 80.0–100.0)
MONO ABS: 1 10*3/uL (ref 0.2–1.0)
Monocytes Relative: 12 %
NEUTROS ABS: 5.6 10*3/uL (ref 1.4–6.5)
Neutrophils Relative %: 69 %
Platelets: 230 10*3/uL (ref 150–440)
RBC: 3.85 MIL/uL — AB (ref 4.40–5.90)
RDW: 14.5 % (ref 11.5–14.5)
WBC: 8.1 10*3/uL (ref 3.8–10.6)

## 2015-07-30 LAB — TSH: TSH: 1.138 u[IU]/mL (ref 0.350–4.500)

## 2015-07-30 NOTE — Progress Notes (Signed)
[place]  Telephone:(336) [phone] Fax:(336) [phone]  ID: Bonnita Levan OB: [birth year]  MR#: [id number]  [id number]  Patient Care Team: Emelia Loron, MD as PCP - General (Family Medicine)  CHIEF COMPLAINT:  Chief Complaint  Patient presents with  . tongue cancer    INTERVAL HISTORY:  Patient returns to clinic today for 3 month follow up of tongue cancer. He currently feels well and is asymptomatic. He has a good appetite and is gaining weight. He does not complain of dysphagia today. He denies any fevers. He has no neurologic complaints. He has no chest pain or shortness of breath. He denies any nausea, vomiting, constipation, or diarrhea. He has no urinary complaints. Patient offers no specific complaints today.   REVIEW OF SYSTEMS:   Review of Systems  Constitutional: Negative for fever and weight loss.  HENT: Negative.   Respiratory: Negative.   Cardiovascular: Negative.   Musculoskeletal: Negative.   Neurological: Negative.  Negative for weakness.    As per HPI. Otherwise, a complete review of systems is negatve.  PAST MEDICAL HISTORY: Past Medical History  Diagnosis Date  . Arthritis   . Cancer [facility])     HEAD/NECK/TONGUE CANCER  . Colon cancer ([place])   . Depression   . Hyperlipidemia   . HTN (hypertension)   . Squamous cell cancer of tongue ([place])     PAST SURGICAL HISTORY: Past Surgical History  Procedure Laterality Date  . Colectomy    . Splenectomy      FAMILY HISTORY Family History  Problem Relation Age of Onset  . Hypercholesterolemia      FAMILY HX  . Head & neck cancer Father        ADVANCED DIRECTIVES:    HEALTH MAINTENANCE: Social History  Substance Use Topics  . Smoking status: Former Smoker -- 1.50 packs/day for 20 years    Types: Cigarettes  . Smokeless tobacco: Former Systems developer    Quit date: 03/06/1985     Comment: HEAVY TOBACCO USE. QUIT GREATER THAN 15 YRS AGO  . Alcohol Use: 0.0 oz/week    0 Standard  drinks or equivalent per week     Comment: Patient states "very little     Colonoscopy:  PAP:  Bone density:  Lipid panel:  Allergies  Allergen Reactions  . Penicillin G     Other reaction(s): Itching of Skin, Localized superficial swelling of skin  . Penicillins Itching and Swelling  . Tramadol     Other reaction(s): Unknown    Current Outpatient Prescriptions  Medication Sig Dispense Refill  . amLODipine (NORVASC) 10 MG tablet Take 10 mg by mouth daily.    [name] atenolol-chlorthalidone (TENORETIC) 50-25 MG per tablet Take 1 tablet by mouth daily.  5  . cyclobenzaprine (FLEXERIL) 10 MG tablet Take 10 mg by mouth 3 (three) times daily as needed for muscle spasms.    . diazepam (VALIUM) 10 MG tablet Take 10 mg by mouth at bedtime.  0  . FENOFIBRATE PO Take 1 tablet by mouth 1 day or 1 dose.    . hydrochlorothiazide (MICROZIDE) 12.5 MG capsule Take 1 capsule by mouth 1 day or 1 dose.    . ibuprofen (ADVIL,MOTRIN) 800 MG tablet Take 1 tablet by mouth 3 (three) times daily as needed. PAIN    . lisinopril (PRINIVIL,ZESTRIL) 40 MG tablet Take 40 mg by mouth daily.  5  . mirtazapine (REMERON) 15 MG tablet Take 15 mg by mouth at bedtime.    . Omega-3 Fatty  Acids (FISH OIL) 1000 MG CAPS Take 1 capsule by mouth.    . Oxycodone HCl 10 MG TABS Take 1 tablet (10 mg total) by mouth every 8 (eight) hours as needed. (Patient taking differently: Take 10 mg by mouth every 8 (eight) hours as needed (pain). ) 90 tablet 0  . simvastatin (ZOCOR) 20 MG tablet Take 20 mg by mouth at bedtime.  5  . vitamin C (ASCORBIC ACID) 500 MG tablet Take by mouth 1 day or 1 dose.     No current facility-administered medications for this visit.   Facility-Administered Medications Ordered in Other Visits  Medication Dose Route Frequency Provider Last Rate Last Dose  . sodium chloride 0.9 % injection 10 mL  10 mL Intravenous PRN Lloyd Huger, MD   10 mL at 11/03/14 1005    OBJECTIVE: Filed Vitals:   07/30/15  1046  BP: 124/78  Pulse: 89  Temp: 98.2 F (36.8 C)  Resp: 16     Body mass index is 23.1 kg/(m^2).    ECOG FS:0 - Asymptomatic  General: Well-developed, well-nourished, no acute distress. Eyes: anicteric sclera. HEENT: No palpable lymphadenopathy of left neck, oropharynx without erythema or exudate. Lungs: Clear to auscultation bilaterally. Heart: Regular rate and rhythm. No rubs, murmurs, or gallops. Abdomen: Soft, nontender, nondistended. No organomegaly noted, normoactive bowel sounds. Musculoskeletal: No edema, cyanosis, or clubbing. Neuro: Alert, answering all questions appropriately. Cranial nerves grossly intact. Skin: No rashes or petechiae noted. Psych: Normal affect.    LAB RESULTS:  Lab Results  Component Value Date   NA 137 07/30/2015   K 4.1 07/30/2015   CL 105 07/30/2015   CO2 28 07/30/2015   GLUCOSE 86 07/30/2015   BUN 34* 07/30/2015   CREATININE 1.41* 07/30/2015   CALCIUM 9.0 07/30/2015   PROT 6.7 04/05/2015   ALBUMIN 4.0 04/05/2015   AST 17 04/05/2015   ALT 9* 04/05/2015   ALKPHOS 61 04/05/2015   BILITOT 0.8 04/05/2015   GFRNONAA 53* 07/30/2015   GFRAA >60 07/30/2015    Lab Results  Component Value Date   WBC 8.1 07/30/2015   NEUTROABS 5.6 07/30/2015   HGB 12.4* 07/30/2015   HCT 36.1* 07/30/2015   MCV 93.6 07/30/2015   PLT 230 07/30/2015     STUDIES: No results found.  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  PLAN:    1. Tongue cancer:  Patient completed his chemotherapy and XRT on approximately February 01, 2015. No further intervention is needed at this time. Return to clinic in 3 months for further evaluation and CT scan. If this scan is clear, no further imaging will be necessary unless there is suspicion of recurrence.  2. Anemia: Improving, monitor. 3. Renal insufficiency: Creatinine mildly elevated, monitor.   Patient expressed understanding and was in agreement with this plan. He also understands that  He can call clinic at any time with any questions, concerns, or complaints.   Malignant neoplasm of base of tongue   Staging form: Lip and Oral Cavity, AJCC 7th Edition     Clinical stage from 10/18/2014: Stage IVA (T2, N2, M0) - Signed by Lloyd Huger, MD on 10/18/2014   Mayra Reel, NP   07/30/2015 1:03 PM  Patient was seen and evaluated and I agree with the assessment and plan as dictated above.  Lloyd Huger, MD 08/05/2015 6:56 AM

## 2015-07-31 ENCOUNTER — Other Ambulatory Visit: Payer: Medicare Other

## 2015-07-31 ENCOUNTER — Ambulatory Visit: Payer: Medicare Other | Admitting: Oncology

## 2015-07-31 LAB — T4: T4, Total: 6.2 ug/dL (ref 4.5–12.0)

## 2015-10-29 ENCOUNTER — Ambulatory Visit
Admission: RE | Admit: 2015-10-29 | Discharge: 2015-10-29 | Disposition: A | Discharge status: Home / Self Care | Payer: Medicare Other | Source: Ambulatory Visit | Attending: Oncology | Admitting: Oncology

## 2015-10-29 ENCOUNTER — Inpatient Hospital Stay: Payer: Medicare Other | Attending: Oncology

## 2015-10-29 DIAGNOSIS — C01 Malignant neoplasm of base of tongue: Secondary | ICD-10-CM

## 2015-10-29 DIAGNOSIS — N289 Disorder of kidney and ureter, unspecified: Secondary | ICD-10-CM | POA: Insufficient documentation

## 2015-10-29 DIAGNOSIS — M1990 Unspecified osteoarthritis, unspecified site: Secondary | ICD-10-CM | POA: Diagnosis not present

## 2015-10-29 DIAGNOSIS — Z452 Encounter for adjustment and management of vascular access device: Secondary | ICD-10-CM | POA: Diagnosis not present

## 2015-10-29 DIAGNOSIS — Z9221 Personal history of antineoplastic chemotherapy: Secondary | ICD-10-CM | POA: Diagnosis not present

## 2015-10-29 DIAGNOSIS — E785 Hyperlipidemia, unspecified: Secondary | ICD-10-CM | POA: Insufficient documentation

## 2015-10-29 DIAGNOSIS — C801 Malignant (primary) neoplasm, unspecified: Secondary | ICD-10-CM | POA: Diagnosis present

## 2015-10-29 DIAGNOSIS — Z87891 Personal history of nicotine dependence: Secondary | ICD-10-CM | POA: Diagnosis not present

## 2015-10-29 DIAGNOSIS — Z85038 Personal history of other malignant neoplasm of large intestine: Secondary | ICD-10-CM | POA: Insufficient documentation

## 2015-10-29 DIAGNOSIS — I10 Essential (primary) hypertension: Secondary | ICD-10-CM | POA: Diagnosis not present

## 2015-10-29 DIAGNOSIS — I6782 Cerebral ischemia: Secondary | ICD-10-CM | POA: Insufficient documentation

## 2015-10-29 DIAGNOSIS — Z79899 Other long term (current) drug therapy: Secondary | ICD-10-CM | POA: Insufficient documentation

## 2015-10-29 DIAGNOSIS — IMO0002 Reserved for concepts with insufficient information to code with codable children: Secondary | ICD-10-CM

## 2015-10-29 LAB — BASIC METABOLIC PANEL
ANION GAP: 7 (ref 5–15)
BUN: 41 mg/dL — ABNORMAL HIGH (ref 6–20)
CALCIUM: 9.2 mg/dL (ref 8.9–10.3)
CO2: 29 mmol/L (ref 22–32)
CREATININE: 1.78 mg/dL — AB (ref 0.61–1.24)
Chloride: 99 mmol/L — ABNORMAL LOW (ref 101–111)
GFR, EST AFRICAN AMERICAN: 47 mL/min — AB (ref >60)
GFR, EST NON AFRICAN AMERICAN: 40 mL/min — AB (ref >60)
Glucose, Bld: 92 mg/dL (ref 65–99)
Potassium: 4.1 mmol/L (ref 3.5–5.1)
SODIUM: 135 mmol/L (ref 135–145)

## 2015-10-29 LAB — CBC WITH DIFFERENTIAL/PLATELET
BASOS ABS: 0.1 10*3/uL (ref 0–0.1)
BASOS PCT: 1 %
EOS ABS: 0.2 10*3/uL (ref 0–0.7)
Eosinophils Relative: 3 %
HEMATOCRIT: 37.9 % — AB (ref 40.0–52.0)
Hemoglobin: 13 g/dL (ref 13.0–18.0)
Lymphocytes Relative: 16 %
Lymphs Abs: 1.5 10*3/uL (ref 1.0–3.6)
MCH: 31.6 pg (ref 26.0–34.0)
MCHC: 34.3 g/dL (ref 32.0–36.0)
MCV: 92.1 fL (ref 80.0–100.0)
MONO ABS: 1.5 10*3/uL — AB (ref 0.2–1.0)
MONOS PCT: 16 %
NEUTROS ABS: 6.4 10*3/uL (ref 1.4–6.5)
Neutrophils Relative %: 64 %
PLATELETS: 238 10*3/uL (ref 150–440)
RBC: 4.11 MIL/uL — ABNORMAL LOW (ref 4.40–5.90)
RDW: 14.7 % — AB (ref 11.5–14.5)
WBC: 9.8 10*3/uL (ref 3.8–10.6)

## 2015-10-30 ENCOUNTER — Other Ambulatory Visit: Payer: Medicare Other

## 2015-10-30 ENCOUNTER — Ambulatory Visit: Payer: Medicare Other | Admitting: Oncology

## 2015-11-07 ENCOUNTER — Inpatient Hospital Stay (HOSPITAL_BASED_OUTPATIENT_CLINIC_OR_DEPARTMENT_OTHER): Payer: Medicare Other | Admitting: Oncology

## 2015-11-07 ENCOUNTER — Inpatient Hospital Stay: Payer: Medicare Other

## 2015-11-07 VITALS — BP 120/77 | HR 87 | Temp 98.0°F | Resp 16 | Wt 161.4 lb

## 2015-11-07 DIAGNOSIS — Z95828 Presence of other vascular implants and grafts: Secondary | ICD-10-CM

## 2015-11-07 DIAGNOSIS — Z9221 Personal history of antineoplastic chemotherapy: Secondary | ICD-10-CM | POA: Diagnosis not present

## 2015-11-07 DIAGNOSIS — Z79899 Other long term (current) drug therapy: Secondary | ICD-10-CM

## 2015-11-07 DIAGNOSIS — I10 Essential (primary) hypertension: Secondary | ICD-10-CM | POA: Diagnosis not present

## 2015-11-07 DIAGNOSIS — C01 Malignant neoplasm of base of tongue: Secondary | ICD-10-CM

## 2015-11-07 DIAGNOSIS — M1990 Unspecified osteoarthritis, unspecified site: Secondary | ICD-10-CM

## 2015-11-07 DIAGNOSIS — E785 Hyperlipidemia, unspecified: Secondary | ICD-10-CM

## 2015-11-07 DIAGNOSIS — N289 Disorder of kidney and ureter, unspecified: Secondary | ICD-10-CM

## 2015-11-07 DIAGNOSIS — Z85038 Personal history of other malignant neoplasm of large intestine: Secondary | ICD-10-CM

## 2015-11-07 DIAGNOSIS — Z87891 Personal history of nicotine dependence: Secondary | ICD-10-CM

## 2015-11-07 DIAGNOSIS — IMO0002 Reserved for concepts with insufficient information to code with codable children: Secondary | ICD-10-CM

## 2015-11-07 MED ORDER — HEPARIN SOD (PORK) LOCK FLUSH 100 UNIT/ML IV SOLN
INTRAVENOUS | Status: AC
  Filled 2015-11-07: qty 5

## 2015-11-07 MED ORDER — SODIUM CHLORIDE 0.9% FLUSH
10.0000 mL | Freq: Once | INTRAVENOUS | Status: AC
  Administered 2015-11-07: 10 mL via INTRAVENOUS
  Filled 2015-11-07: qty 10

## 2015-11-07 MED ORDER — HEPARIN SOD (PORK) LOCK FLUSH 100 UNIT/ML IV SOLN
500.0000 [IU] | Freq: Once | INTRAVENOUS | Status: AC
  Administered 2015-11-07: 500 [IU] via INTRAVENOUS

## 2015-11-11 NOTE — Progress Notes (Addendum)
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  INTERVAL HISTORY:  Patient returns to clinic today for 3 month follow up of tongue cancer. He continues to feel well and is asymptomatic. He has a good appetite and continues to gain weight. He does not complain of dysphagia today. He denies any fevers. He has no neurologic complaints. He has no chest pain or shortness of breath. He denies any nausea, vomiting, constipation, or diarrhea. He has no urinary complaints. Patient offers no specific complaints today.   REVIEW OF SYSTEMS:   Review of Systems  Constitutional: Negative.  Negative for fever, weight loss and malaise/fatigue.  HENT: Negative.  Negative for sore throat.   Respiratory: Negative.  Negative for cough and shortness of breath.   Cardiovascular: Negative.  Negative for chest pain.  Gastrointestinal: Negative.   Genitourinary: Negative.   Musculoskeletal: Negative.   Neurological: Negative.  Negative for weakness and headaches.  Psychiatric/Behavioral: Negative.     As per HPI. Otherwise, a complete review of systems is negatve.  PAST MEDICAL HISTORY: Past Medical History  Diagnosis Date  . Arthritis   . Cancer [facility])     HEAD/NECK/TONGUE CANCER  . Colon cancer ([place])   . Depression   . Hyperlipidemia   . HTN (hypertension)   . Squamous cell cancer of tongue ([place])     PAST SURGICAL HISTORY: Past Surgical History  Procedure Laterality Date  . Colectomy    . Splenectomy      FAMILY HISTORY Family History  Problem Relation Age of Onset  . Hypercholesterolemia      FAMILY HX  . Head & neck cancer Father        ADVANCED DIRECTIVES:    HEALTH MAINTENANCE: Social History  Substance Use Topics  . Smoking status:  Former Smoker -- 1.50 packs/day for 20 years    Types: Cigarettes  . Smokeless tobacco: Former Systems developer    Quit date: 03/06/1985     Comment: HEAVY TOBACCO USE. QUIT GREATER THAN 15 YRS AGO  . Alcohol Use: 0.0 oz/week    0 Standard drinks or equivalent per week     Comment: Patient states "very little     Colonoscopy:  PAP:  Bone density:  Lipid panel:  Allergies  Allergen Reactions  . Penicillin G     Other reaction(s): Itching of Skin, Localized superficial swelling of skin  . Penicillins Itching and Swelling  . Tramadol     Other reaction(s): Unknown    Current Outpatient Prescriptions  Medication Sig Dispense Refill  . amLODipine (NORVASC) 10 MG tablet Take 10 mg by mouth daily.    [name] atenolol-chlorthalidone (TENORETIC) 50-25 MG per tablet Take 1 tablet by mouth daily.  5  . cyclobenzaprine (FLEXERIL) 10 MG tablet Take 10 mg by mouth 3 (three) times daily as needed for muscle spasms.    . diazepam (VALIUM) 10 MG tablet Take 10 mg by mouth at bedtime.  0  . FENOFIBRATE PO Take 1 tablet by mouth 1 day or 1 dose.    . hydrochlorothiazide (MICROZIDE) 12.5 MG capsule Take 1 capsule by mouth 1 day or 1 dose.    . ibuprofen (ADVIL,MOTRIN) 800 MG tablet Take 1 tablet by mouth 3 (three) times daily as needed. PAIN    . lisinopril (  PRINIVIL,ZESTRIL) 40 MG tablet Take 40 mg by mouth daily.  5  . mirtazapine (REMERON) 15 MG tablet Take 15 mg by mouth at bedtime.    . Omega-3 Fatty Acids (FISH OIL) 1000 MG CAPS Take 1 capsule by mouth.    . Oxycodone HCl 10 MG TABS Take 1 tablet (10 mg total) by mouth every 8 (eight) hours as needed. (Patient taking differently: Take 10 mg by mouth every 8 (eight) hours as needed (pain). ) 90 tablet 0  . simvastatin (ZOCOR) 20 MG tablet Take 20 mg by mouth at bedtime.  5  . vitamin C (ASCORBIC ACID) 500 MG tablet Take by mouth 1 day or 1 dose.     No current facility-administered medications for this visit.   Facility-Administered Medications Ordered in  Other Visits  Medication Dose Route Frequency Provider Last Rate Last Dose  . sodium chloride 0.9 % injection 10 mL  10 mL Intravenous PRN Lloyd Huger, MD   10 mL at 11/03/14 1005    OBJECTIVE: Filed Vitals:   11/07/15 1030  BP: 120/77  Pulse: 87  Temp: 98 F (36.7 C)  Resp: 16     Body mass index is 23.82 kg/(m^2).    ECOG FS:0 - Asymptomatic  General: Well-developed, well-nourished, no acute distress. Eyes: anicteric sclera. HEENT: No palpable lymphadenopathy, oropharynx without erythema or exudate. Lungs: Clear to auscultation bilaterally. Heart: Regular rate and rhythm. No rubs, murmurs, or gallops. Abdomen: Soft, nontender, nondistended. No organomegaly noted, normoactive bowel sounds. Musculoskeletal: No edema, cyanosis, or clubbing. Neuro: Alert, answering all questions appropriately. Cranial nerves grossly intact. Skin: No rashes or petechiae noted. Psych: Normal affect.    LAB RESULTS:  Lab Results  Component Value Date   NA 135 10/29/2015   K 4.1 10/29/2015   CL 99* 10/29/2015   CO2 29 10/29/2015   GLUCOSE 92 10/29/2015   BUN 41* 10/29/2015   CREATININE 1.78* 10/29/2015   CALCIUM 9.2 10/29/2015   PROT 6.7 04/05/2015   ALBUMIN 4.0 04/05/2015   AST 17 04/05/2015   ALT 9* 04/05/2015   ALKPHOS 61 04/05/2015   BILITOT 0.8 04/05/2015   GFRNONAA 40* 10/29/2015   GFRAA 47* 10/29/2015    Lab Results  Component Value Date   WBC 9.8 10/29/2015   NEUTROABS 6.4 10/29/2015   HGB 13.0 10/29/2015   HCT 37.9* 10/29/2015   MCV 92.1 10/29/2015   PLT 238 10/29/2015     STUDIES: Ct Head Wo Contrast  10/29/2015  CLINICAL DATA:  60 year old male with Stage III squamous cell carcinoma the left tongue base (T2 N2 B M0). Left temporal lobe hypodensity questioned on head CT October 2016- but confirmed as artifact on subsequent 04/05/2015 brain MRI without and with contrast. Subsequent encounter. EXAM: CT HEAD WITHOUT CONTRAST TECHNIQUE: Contiguous axial images  were obtained from the base of the skull through the vertex without intravenous contrast. COMPARISON:  PET-CT 04/27/2015. Brain MRI without with contrast 04/05/2015, head CT without contrast 04/05/2015 FINDINGS: Stable and negative paranasal sinuses and mastoids, small right maxillary retention cyst. Visualized osseous structures are stable and within normal limits. Negative orbit and scalp soft tissues. Calcified atherosclerosis at the skull base. 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 Stable and normal gray-white matter differentiation elsewhere. No midline shift, mass effect, or evidence of intracranial mass lesion. No ventriculomegaly. No acute intracranial hemorrhage  identified. No suspicious intracranial vascular hyperdensity. 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 Note that a without and with contrast exam (either Brain MRI or head CT without and with contrast) would be necessary to fully re-stage the brain. Electronically Signed   By: Genevie Ann M.D.   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  PLAN:    1. 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 No further intervention is needed at this time. Patient reports he had an ENT visit recently within normal exam. Return to clinic in 3 months for further evaluation and CT scan. If this scan is clear, no further imaging will be necessary unless there is suspicion of recurrence.  2. Anemia: Patient's hemoglobin is now within normal limits. 3. Renal insufficiency: Creatinine mildly elevated, monitor.   Patient expressed  understanding and was in agreement with this plan. He also understands that He can call clinic at any time with any questions, concerns, or complaints.   Malignant neoplasm of base of tongue   Staging form: Lip and Oral Cavity, AJCC 7th Edition     Clinical stage from 10/18/2014: Stage IVA (T2, N2, M0) - Signed by Lloyd Huger, MD on 10/18/2014   Lloyd Huger, MD   11/11/2015 4:32 PM

## 2015-12-19 ENCOUNTER — Inpatient Hospital Stay: Payer: Medicare Other | Attending: Oncology

## 2016-01-22 ENCOUNTER — Ambulatory Visit: Admission: RE | Admit: 2016-01-22 | Payer: Medicare Other | Source: Ambulatory Visit

## 2016-01-22 ENCOUNTER — Other Ambulatory Visit: Payer: Self-pay | Admitting: *Deleted

## 2016-01-22 DIAGNOSIS — C760 Malignant neoplasm of head, face and neck: Secondary | ICD-10-CM

## 2016-01-27 NOTE — Progress Notes (Deleted)
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  INTERVAL HISTORY:  Patient returns to clinic today for 3 month follow up of tongue cancer. He continues to feel well and is asymptomatic. He has a good appetite and continues to gain weight. He does not complain of dysphagia today. He denies any fevers. He has no neurologic complaints. He has no chest pain or shortness of breath. He denies any nausea, vomiting, constipation, or diarrhea. He has no urinary complaints. Patient offers no specific complaints today.   REVIEW OF SYSTEMS:   Review of Systems  Constitutional: Negative.  Negative for fever, malaise/fatigue and weight loss.  HENT: Negative.  Negative for sore throat.   Respiratory: Negative.  Negative for cough and shortness of breath.   Cardiovascular: Negative.  Negative for chest pain.  Gastrointestinal: Negative.   Genitourinary: Negative.   Musculoskeletal: Negative.   Neurological: Negative.  Negative for weakness and headaches.  Psychiatric/Behavioral: Negative.     As per HPI. Otherwise, a complete review of systems is negatve.  PAST MEDICAL HISTORY: Past Medical History:  Diagnosis Date  . Arthritis   . Cancer [facility])    HEAD/NECK/TONGUE CANCER  . Colon cancer ([place])   . Depression   . HTN (hypertension)   . Hyperlipidemia   . Squamous cell cancer of tongue ([place])     PAST SURGICAL HISTORY: Past Surgical History:  Procedure Laterality Date  . COLECTOMY    . SPLENECTOMY      FAMILY HISTORY Family History  Problem Relation Age of Onset  . Hypercholesterolemia      FAMILY HX  . Head & neck cancer Father        ADVANCED DIRECTIVES:    HEALTH MAINTENANCE: Social History  Substance Use Topics  . Smoking status:  Former Smoker    Packs/day: 1.50    Years: 20.00    Types: Cigarettes  . Smokeless tobacco: Former Systems developer    Quit date: 03/06/1985     Comment: HEAVY TOBACCO USE. QUIT GREATER THAN 15 YRS AGO  . Alcohol use 0.0 oz/week     Comment: Patient states "very little     Colonoscopy:  PAP:  Bone density:  Lipid panel:  Allergies  Allergen Reactions  . Penicillin G     Other reaction(s): Itching of Skin, Localized superficial swelling of skin  . Penicillins Itching and Swelling  . Tramadol     Other reaction(s): Unknown    Current Outpatient Prescriptions  Medication Sig Dispense Refill  . amLODipine (NORVASC) 10 MG tablet Take 10 mg by mouth daily.    [name] atenolol-chlorthalidone (TENORETIC) 50-25 MG per tablet Take 1 tablet by mouth daily.  5  . cyclobenzaprine (FLEXERIL) 10 MG tablet Take 10 mg by mouth 3 (three) times daily as needed for muscle spasms.    . diazepam (VALIUM) 10 MG tablet Take 10 mg by mouth at bedtime.  0  . FENOFIBRATE PO Take 1 tablet by mouth 1 day or 1 dose.    . hydrochlorothiazide (MICROZIDE) 12.5 MG capsule Take 1 capsule by mouth 1 day or 1 dose.    . ibuprofen (ADVIL,MOTRIN) 800 MG tablet Take 1 tablet by mouth 3 (three) times daily as needed. PAIN    . lisinopril (PRINIVIL,ZESTRIL) 40 MG tablet Take 40 mg  by mouth daily.  5  . mirtazapine (REMERON) 15 MG tablet Take 15 mg by mouth at bedtime.    . Omega-3 Fatty Acids (FISH OIL) 1000 MG CAPS Take 1 capsule by mouth.    . Oxycodone HCl 10 MG TABS Take 1 tablet (10 mg total) by mouth every 8 (eight) hours as needed. (Patient taking differently: Take 10 mg by mouth every 8 (eight) hours as needed (pain). ) 90 tablet 0  . simvastatin (ZOCOR) 20 MG tablet Take 20 mg by mouth at bedtime.  5  . vitamin C (ASCORBIC ACID) 500 MG tablet Take by mouth 1 day or 1 dose.     No current facility-administered medications for this visit.    Facility-Administered Medications Ordered in Other Visits  Medication Dose Route  Frequency Provider Last Rate Last Dose  . sodium chloride 0.9 % injection 10 mL  10 mL Intravenous PRN Lloyd Huger, MD   10 mL at 11/03/14 1005    OBJECTIVE: There were no vitals filed for this visit.   There is no height or weight on file to calculate BMI.    ECOG FS:0 - Asymptomatic  General: Well-developed, well-nourished, no acute distress. Eyes: anicteric sclera. HEENT: No palpable lymphadenopathy, oropharynx without erythema or exudate. Lungs: Clear to auscultation bilaterally. Heart: Regular rate and rhythm. No rubs, murmurs, or gallops. Abdomen: Soft, nontender, nondistended. No organomegaly noted, normoactive bowel sounds. Musculoskeletal: No edema, cyanosis, or clubbing. Neuro: Alert, answering all questions appropriately. Cranial nerves grossly intact. Skin: No rashes or petechiae noted. Psych: Normal affect.    LAB RESULTS:  Lab Results  Component Value Date   NA 135 10/29/2015   K 4.1 10/29/2015   CL 99 (L) 10/29/2015   CO2 29 10/29/2015   GLUCOSE 92 10/29/2015   BUN 41 (H) 10/29/2015   CREATININE 1.78 (H) 10/29/2015   CALCIUM 9.2 10/29/2015   PROT 6.7 04/05/2015   ALBUMIN 4.0 04/05/2015   AST 17 04/05/2015   ALT 9 (L) 04/05/2015   ALKPHOS 61 04/05/2015   BILITOT 0.8 04/05/2015   GFRNONAA 40 (L) 10/29/2015   GFRAA 47 (L) 10/29/2015    Lab Results  Component Value Date   WBC 9.8 10/29/2015   NEUTROABS 6.4 10/29/2015   HGB 13.0 10/29/2015   HCT 37.9 (L) 10/29/2015   MCV 92.1 10/29/2015   PLT 238 10/29/2015     STUDIES: No results found.  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  PLAN:    1. 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 No further intervention is needed at this time. Patient reports he had an ENT visit recently within normal exam. Return to clinic in 3 months for further evaluation and CT scan. If this scan is clear,  no further imaging will be necessary unless there is suspicion of recurrence.  2. Anemia: Patient's hemoglobin is now within normal limits. 3. Renal insufficiency: Creatinine mildly elevated, monitor.   Patient expressed understanding and was in agreement with this plan. He also understands that He can call clinic at any time with any questions, concerns, or complaints.   Malignant neoplasm of base of tongue   Staging form: Lip and Oral Cavity, AJCC 7th Edition     Clinical stage from 10/18/2014: Stage IVA (T2, N2, M0) - Signed by Lloyd Huger, MD on 10/18/2014   Lloyd Huger, MD   01/27/2016 11:20 PM

## 2016-01-28 ENCOUNTER — Inpatient Hospital Stay: Payer: Medicare Other

## 2016-01-28 ENCOUNTER — Inpatient Hospital Stay: Payer: Medicare Other | Admitting: Oncology

## 2016-01-28 ENCOUNTER — Ambulatory Visit: Payer: Medicare Other | Attending: Radiation Oncology | Admitting: Radiation Oncology

## 2016-02-10 NOTE — Progress Notes (Signed)
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  INTERVAL HISTORY:  Patient returns to clinic today for 3 month follow up of tongue cancer. He continues to feel well and is asymptomatic. He has a good appetite and continues to gain weight. He does not complain of dysphagia today. He denies any fevers. He has no neurologic complaints. He has no chest pain or shortness of breath. He denies any nausea, vomiting, constipation, or diarrhea. He has no urinary complaints. Patient offers no specific complaints today.   REVIEW OF SYSTEMS:   Review of Systems  Constitutional: Negative.  Negative for fever, malaise/fatigue and weight loss.  HENT: Negative.  Negative for sore throat.   Respiratory: Negative.  Negative for cough and shortness of breath.   Cardiovascular: Negative.  Negative for chest pain.  Gastrointestinal: Negative.   Genitourinary: Negative.   Musculoskeletal: Negative.   Neurological: Negative.  Negative for weakness and headaches.  Psychiatric/Behavioral: Negative.     As per HPI. Otherwise, a complete review of systems is negatve.  PAST MEDICAL HISTORY: Past Medical History:  Diagnosis Date  . Arthritis   . Cancer [facility])    HEAD/NECK/TONGUE CANCER  . Colon cancer ([place])   . Depression   . HTN (hypertension)   . Hyperlipidemia   . Squamous cell cancer of tongue ([place])     PAST SURGICAL HISTORY: Past Surgical History:  Procedure Laterality Date  . COLECTOMY    . SPLENECTOMY      FAMILY HISTORY Family History  Problem Relation Age of Onset  . Hypercholesterolemia      FAMILY HX  . Head & neck cancer Father        ADVANCED DIRECTIVES:    HEALTH MAINTENANCE: Social History  Substance Use Topics  . Smoking status:  Former Smoker    Packs/day: 1.50    Years: 20.00    Types: Cigarettes  . Smokeless tobacco: Former Systems developer    Quit date: 03/06/1985     Comment: HEAVY TOBACCO USE. QUIT GREATER THAN 15 YRS AGO  . Alcohol use 0.0 oz/week     Comment: Patient states "very little     Colonoscopy:  PAP:  Bone density:  Lipid panel:  Allergies  Allergen Reactions  . Penicillin G     Other reaction(s): Itching of Skin, Localized superficial swelling of skin  . Penicillins Itching and Swelling  . Tramadol     Other reaction(s): Unknown    Current Outpatient Prescriptions  Medication Sig Dispense Refill  . amLODipine (NORVASC) 10 MG tablet Take 10 mg by mouth daily.    . cyclobenzaprine (FLEXERIL) 10 MG tablet Take 10 mg by mouth 3 (three) times daily as needed for muscle spasms.    . diazepam (VALIUM) 10 MG tablet Take 10 mg by mouth at bedtime.  0  . ibuprofen (ADVIL,MOTRIN) 800 MG tablet Take 1 tablet by mouth 3 (three) times daily as needed. PAIN    . Omega-3 Fatty Acids (FISH OIL) 1000 MG CAPS Take 1 capsule by mouth.    . Oxycodone HCl 10 MG TABS Take 1 tablet (10 mg total) by mouth every 8 (eight) hours as needed. (Patient taking differently: Take 10 mg by mouth every 8 (eight) hours as needed (pain). ) 90 tablet 0  . simvastatin (ZOCOR)  20 MG tablet Take 20 mg by mouth at bedtime.  5  . vitamin C (ASCORBIC ACID) 500 MG tablet Take by mouth 1 day or 1 dose.     No current facility-administered medications for this visit.    Facility-Administered Medications Ordered in Other Visits  Medication Dose Route Frequency Provider Last Rate Last Dose  . sodium chloride 0.9 % injection 10 mL  10 mL Intravenous PRN Lloyd Huger, MD   10 mL at 11/03/14 1005    OBJECTIVE: Vitals:   02/11/16 0928  BP: 138/89  Pulse: 96  Resp: 16  Temp: (!) 95.9 F (35.5 C)     Body mass index is 23.98 kg/m.    ECOG FS:0 - Asymptomatic  General: Well-developed, well-nourished, no acute distress. Eyes:  anicteric sclera. HEENT: No palpable lymphadenopathy, oropharynx without erythema or exudate. Lungs: Clear to auscultation bilaterally. Heart: Regular rate and rhythm. No rubs, murmurs, or gallops. Abdomen: Soft, nontender, nondistended. No organomegaly noted, normoactive bowel sounds. Musculoskeletal: No edema, cyanosis, or clubbing. Neuro: Alert, answering all questions appropriately. Cranial nerves grossly intact. Skin: No rashes or petechiae noted. Psych: Normal affect.    LAB RESULTS:  Lab Results  Component Value Date   NA 136 02/11/2016   K 4.0 02/11/2016   CL 101 02/11/2016   CO2 27 02/11/2016   GLUCOSE 96 02/11/2016   BUN 28 (H) 02/11/2016   CREATININE 1.42 (H) 02/11/2016   CALCIUM 9.3 02/11/2016   PROT 7.3 02/11/2016   ALBUMIN 4.5 02/11/2016   AST 25 02/11/2016   ALT 15 (L) 02/11/2016   ALKPHOS 64 02/11/2016   BILITOT 0.8 02/11/2016   GFRNONAA 53 (L) 02/11/2016   GFRAA >60 02/11/2016    Lab Results  Component Value Date   WBC 7.6 02/11/2016   NEUTROABS 4.8 02/11/2016   HGB 14.0 02/11/2016   HCT 41.0 02/11/2016   MCV 92.9 02/11/2016   PLT 219 02/11/2016     STUDIES: No results found.  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  PLAN:    1. 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 No further intervention is needed at this time. Patient reports he had an ENT visit recently within normal exam and is scheduled to go back in the next 1-2 months. Patient has now skipped multiple CT scans to assess for recurrence, therefore will not reschedule any further imaging unless there is suspicion of recurrence. Return to clinic in 4 months for routine evaluation. 2. Anemia: Patient's hemoglobin is now within normal limits. 3. Renal insufficiency: Creatinine mildly elevated, monitor.   Patient expressed understanding and was in agreement with this plan. He  also understands that He can call clinic at any time with any questions, concerns, or complaints.   Malignant neoplasm of base of tongue   Staging form: Lip and Oral Cavity, AJCC 7th Edition     Clinical stage from 10/18/2014: Stage IVA (T2, N2, M0) - Signed by Lloyd Huger, MD on 10/18/2014   Lloyd Huger, MD   02/12/2016 9:05 AM

## 2016-02-11 ENCOUNTER — Inpatient Hospital Stay: Payer: Medicare Other

## 2016-02-11 ENCOUNTER — Inpatient Hospital Stay: Payer: Medicare Other | Attending: Oncology | Admitting: Oncology

## 2016-02-11 VITALS — BP 138/89 | HR 96 | Temp 95.9°F | Resp 16 | Ht 69.0 in | Wt 162.4 lb

## 2016-02-11 DIAGNOSIS — Z9049 Acquired absence of other specified parts of digestive tract: Secondary | ICD-10-CM | POA: Insufficient documentation

## 2016-02-11 DIAGNOSIS — Z85038 Personal history of other malignant neoplasm of large intestine: Secondary | ICD-10-CM | POA: Diagnosis not present

## 2016-02-11 DIAGNOSIS — Z923 Personal history of irradiation: Secondary | ICD-10-CM | POA: Diagnosis not present

## 2016-02-11 DIAGNOSIS — N289 Disorder of kidney and ureter, unspecified: Secondary | ICD-10-CM | POA: Diagnosis not present

## 2016-02-11 DIAGNOSIS — Z85810 Personal history of malignant neoplasm of tongue: Secondary | ICD-10-CM | POA: Diagnosis present

## 2016-02-11 DIAGNOSIS — Z87891 Personal history of nicotine dependence: Secondary | ICD-10-CM | POA: Insufficient documentation

## 2016-02-11 DIAGNOSIS — Z9081 Acquired absence of spleen: Secondary | ICD-10-CM | POA: Diagnosis not present

## 2016-02-11 DIAGNOSIS — I10 Essential (primary) hypertension: Secondary | ICD-10-CM

## 2016-02-11 DIAGNOSIS — E785 Hyperlipidemia, unspecified: Secondary | ICD-10-CM | POA: Insufficient documentation

## 2016-02-11 DIAGNOSIS — Z9221 Personal history of antineoplastic chemotherapy: Secondary | ICD-10-CM | POA: Diagnosis not present

## 2016-02-11 DIAGNOSIS — M1990 Unspecified osteoarthritis, unspecified site: Secondary | ICD-10-CM | POA: Insufficient documentation

## 2016-02-11 DIAGNOSIS — Z79899 Other long term (current) drug therapy: Secondary | ICD-10-CM | POA: Insufficient documentation

## 2016-02-11 DIAGNOSIS — D649 Anemia, unspecified: Secondary | ICD-10-CM | POA: Insufficient documentation

## 2016-02-11 DIAGNOSIS — C01 Malignant neoplasm of base of tongue: Secondary | ICD-10-CM

## 2016-02-11 DIAGNOSIS — IMO0002 Reserved for concepts with insufficient information to code with codable children: Secondary | ICD-10-CM

## 2016-02-11 DIAGNOSIS — Z95828 Presence of other vascular implants and grafts: Secondary | ICD-10-CM

## 2016-02-11 DIAGNOSIS — C760 Malignant neoplasm of head, face and neck: Secondary | ICD-10-CM

## 2016-02-11 DIAGNOSIS — R5383 Other fatigue: Secondary | ICD-10-CM

## 2016-02-11 LAB — CBC WITH DIFFERENTIAL/PLATELET
Basophils Absolute: 0.1 10*3/uL (ref 0–0.1)
Basophils Relative: 1 %
EOS PCT: 2 %
Eosinophils Absolute: 0.1 10*3/uL (ref 0–0.7)
HCT: 41 % (ref 40.0–52.0)
Hemoglobin: 14 g/dL (ref 13.0–18.0)
LYMPHS ABS: 1.6 10*3/uL (ref 1.0–3.6)
LYMPHS PCT: 22 %
MCH: 31.7 pg (ref 26.0–34.0)
MCHC: 34.1 g/dL (ref 32.0–36.0)
MCV: 92.9 fL (ref 80.0–100.0)
MONO ABS: 0.9 10*3/uL (ref 0.2–1.0)
Monocytes Relative: 12 %
Neutro Abs: 4.8 10*3/uL (ref 1.4–6.5)
Neutrophils Relative %: 63 %
PLATELETS: 219 10*3/uL (ref 150–440)
RBC: 4.42 MIL/uL (ref 4.40–5.90)
RDW: 14.3 % (ref 11.5–14.5)
WBC: 7.6 10*3/uL (ref 3.8–10.6)

## 2016-02-11 LAB — COMPREHENSIVE METABOLIC PANEL
ALT: 15 U/L — AB (ref 17–63)
AST: 25 U/L (ref 15–41)
Albumin: 4.5 g/dL (ref 3.5–5.0)
Alkaline Phosphatase: 64 U/L (ref 38–126)
Anion gap: 8 (ref 5–15)
BUN: 28 mg/dL — ABNORMAL HIGH (ref 6–20)
CHLORIDE: 101 mmol/L (ref 101–111)
CO2: 27 mmol/L (ref 22–32)
CREATININE: 1.42 mg/dL — AB (ref 0.61–1.24)
Calcium: 9.3 mg/dL (ref 8.9–10.3)
GFR, EST NON AFRICAN AMERICAN: 53 mL/min — AB (ref >60)
Glucose, Bld: 96 mg/dL (ref 65–99)
Potassium: 4 mmol/L (ref 3.5–5.1)
Sodium: 136 mmol/L (ref 135–145)
TOTAL PROTEIN: 7.3 g/dL (ref 6.5–8.1)
Total Bilirubin: 0.8 mg/dL (ref 0.3–1.2)

## 2016-02-11 LAB — TSH: TSH: 6.633 u[IU]/mL — ABNORMAL HIGH (ref 0.350–4.500)

## 2016-02-11 MED ORDER — HEPARIN SOD (PORK) LOCK FLUSH 100 UNIT/ML IV SOLN
500.0000 [IU] | Freq: Once | INTRAVENOUS | Status: AC
  Administered 2016-02-11: 500 [IU] via INTRAVENOUS

## 2016-02-11 MED ORDER — SODIUM CHLORIDE 0.9% FLUSH
10.0000 mL | INTRAVENOUS | Status: DC | PRN
  Administered 2016-02-11: 10 mL via INTRAVENOUS
  Filled 2016-02-11: qty 10

## 2016-02-11 NOTE — Progress Notes (Signed)
Pt would like to know when can port be removed

## 2016-02-12 LAB — T4: T4, Total: 7 ug/dL (ref 4.5–12.0)

## 2016-05-22 DIAGNOSIS — F419 Anxiety disorder, unspecified: Secondary | ICD-10-CM | POA: Insufficient documentation

## 2016-05-22 DIAGNOSIS — E785 Hyperlipidemia, unspecified: Secondary | ICD-10-CM | POA: Insufficient documentation

## 2016-05-22 DIAGNOSIS — E782 Mixed hyperlipidemia: Secondary | ICD-10-CM

## 2016-05-22 DIAGNOSIS — I10 Essential (primary) hypertension: Secondary | ICD-10-CM | POA: Insufficient documentation

## 2016-06-06 IMAGING — CT NM PET TUM IMG RESTAG (PS) SKULL BASE T - THIGH
1 of 9 series · 1 of 25 positions shown · non-contrast
Comparison: Multiple exams, including 09/05/2014

CLINICAL DATA: Subsequent treatment strategy for metastatic
squamous cell head and neck cancer likely arising from the left
vallecular region..

EXAM:
NUCLEAR MEDICINE PET SKULL BASE TO THIGH
TECHNIQUE: 12.2 mCi F-18 FDG was injected intravenously. Full-ring PET imaging
was performed from the skull base to thigh after the radiotracer. CT
data was obtained and used for attenuation correction and anatomic
localization.
FASTING BLOOD GLUCOSE:  Value: 71 mg/dl

[Series 3: ct wb 5.0 b30f · axial · 5.0mm · 0.98mm/px · 1 of 329 slices shown]
[im 329/329  brain]
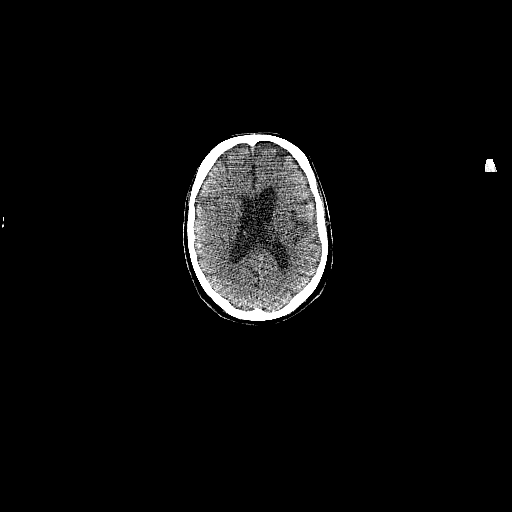

[1 of 25 positions shown; findings below may reference images not displayed]

FINDINGS: NECK

Old left frontal lobe and left external capsule infarcts. Old left
cerebellar infarcts.

Chronic bilateral maxillary sinusitis.

Currently no adenopathy or hypermetabolic activity in the neck.

CHEST

Paraseptal emphysema. Mucus plugging in the left lower lobe.
Superior segment left lower lobe nodule with marginal calcifications
measuring 10 by 9 mm, stable in size, not hypermetabolic, maximum
standard uptake value in this vicinity 1.2.

ABDOMEN/PELVIS

No abnormal hypermetabolic activity within the liver, pancreas,
adrenal glands, or spleen. No hypermetabolic lymph nodes in the
abdomen or pelvis.

Prominent stool throughout the colon favors constipation. Aortoiliac
atherosclerotic vascular disease. Suspected regenerative splenic
activity accounting for several left upper quadrant nodules.

SKELETON

Old healed left posterior rib fractures.
IMPRESSION: 1. No residual or recurrent hypermetabolic activity to suggest
malignancy.
2. 10 mm stable superior segment left lower lobe nodule is not
hypermetabolic at all, and probably related to the scattered mucus
plugging noted in the left lung.
3.  Prominent stool throughout the colon favors constipation.
4. Other imaging findings of potential clinical significance:
Multiple old left-sided cerebral and cerebellar infarcts. Chronic
bilateral maxillary sinusitis. Paraseptal emphysema. Aortoiliac
atherosclerotic vascular disease.

## 2016-06-13 ENCOUNTER — Other Ambulatory Visit: Payer: Self-pay | Admitting: Nurse Practitioner

## 2016-06-23 ENCOUNTER — Inpatient Hospital Stay: Payer: Medicare Other | Admitting: Oncology

## 2016-06-23 ENCOUNTER — Inpatient Hospital Stay: Payer: Medicare Other
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
## 2016-06-24 ENCOUNTER — Ambulatory Visit: Payer: Self-pay | Admitting: Unknown Physician Specialty

## 2016-06-26 ENCOUNTER — Telehealth: Payer: Self-pay | Admitting: *Deleted

## 2016-06-26 NOTE — Telephone Encounter (Signed)
Pt wants PAC removed. Pt was advised to keep app ton 1/29 for follow up and discuss PAC removal with Dr. Grayland Ormond at that time. Pt's brother, Clare Gandy, verbalized understanding.
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
## 2016-07-14 ENCOUNTER — Inpatient Hospital Stay (HOSPITAL_BASED_OUTPATIENT_CLINIC_OR_DEPARTMENT_OTHER): Payer: Medicare Other | Admitting: Oncology

## 2016-07-14 ENCOUNTER — Inpatient Hospital Stay: Payer: Medicare Other | Attending: Oncology

## 2016-07-14 VITALS — BP 171/102 | HR 74 | Temp 96.9°F | Resp 18 | Wt 167.3 lb

## 2016-07-14 DIAGNOSIS — E785 Hyperlipidemia, unspecified: Secondary | ICD-10-CM

## 2016-07-14 DIAGNOSIS — N289 Disorder of kidney and ureter, unspecified: Secondary | ICD-10-CM | POA: Diagnosis not present

## 2016-07-14 DIAGNOSIS — K219 Gastro-esophageal reflux disease without esophagitis: Secondary | ICD-10-CM | POA: Insufficient documentation

## 2016-07-14 DIAGNOSIS — Z801 Family history of malignant neoplasm of trachea, bronchus and lung: Secondary | ICD-10-CM | POA: Insufficient documentation

## 2016-07-14 DIAGNOSIS — R5383 Other fatigue: Secondary | ICD-10-CM

## 2016-07-14 DIAGNOSIS — Z9221 Personal history of antineoplastic chemotherapy: Secondary | ICD-10-CM | POA: Insufficient documentation

## 2016-07-14 DIAGNOSIS — Z800 Family history of malignant neoplasm of digestive organs: Secondary | ICD-10-CM | POA: Diagnosis not present

## 2016-07-14 DIAGNOSIS — Z87891 Personal history of nicotine dependence: Secondary | ICD-10-CM | POA: Insufficient documentation

## 2016-07-14 DIAGNOSIS — Z85038 Personal history of other malignant neoplasm of large intestine: Secondary | ICD-10-CM | POA: Diagnosis not present

## 2016-07-14 DIAGNOSIS — Z79899 Other long term (current) drug therapy: Secondary | ICD-10-CM | POA: Diagnosis not present

## 2016-07-14 DIAGNOSIS — Z85810 Personal history of malignant neoplasm of tongue: Secondary | ICD-10-CM

## 2016-07-14 DIAGNOSIS — Z923 Personal history of irradiation: Secondary | ICD-10-CM | POA: Insufficient documentation

## 2016-07-14 DIAGNOSIS — Z808 Family history of malignant neoplasm of other organs or systems: Secondary | ICD-10-CM | POA: Diagnosis not present

## 2016-07-14 DIAGNOSIS — C01 Malignant neoplasm of base of tongue: Secondary | ICD-10-CM

## 2016-07-14 DIAGNOSIS — Z9081 Acquired absence of spleen: Secondary | ICD-10-CM | POA: Insufficient documentation

## 2016-07-14 DIAGNOSIS — Z880 Allergy status to penicillin: Secondary | ICD-10-CM | POA: Insufficient documentation

## 2016-07-14 DIAGNOSIS — I10 Essential (primary) hypertension: Secondary | ICD-10-CM

## 2016-07-14 LAB — CBC WITH DIFFERENTIAL/PLATELET
BASOS PCT: 1 %
Basophils Absolute: 0.1 10*3/uL (ref 0–0.1)
Eosinophils Absolute: 0.1 10*3/uL (ref 0–0.7)
Eosinophils Relative: 1 %
HEMATOCRIT: 42.6 % (ref 40.0–52.0)
Hemoglobin: 14.7 g/dL (ref 13.0–18.0)
LYMPHS PCT: 21 %
Lymphs Abs: 1.6 10*3/uL (ref 1.0–3.6)
MCH: 32.4 pg (ref 26.0–34.0)
MCHC: 34.5 g/dL (ref 32.0–36.0)
MCV: 94 fL (ref 80.0–100.0)
MONO ABS: 0.8 10*3/uL (ref 0.2–1.0)
MONOS PCT: 11 %
NEUTROS ABS: 4.8 10*3/uL (ref 1.4–6.5)
Neutrophils Relative %: 66 %
Platelets: 212 10*3/uL (ref 150–440)
RBC: 4.53 MIL/uL (ref 4.40–5.90)
RDW: 14.1 % (ref 11.5–14.5)
WBC: 7.3 10*3/uL (ref 3.8–10.6)

## 2016-07-14 LAB — COMPREHENSIVE METABOLIC PANEL
ALK PHOS: 55 U/L (ref 38–126)
ALT: 14 U/L — ABNORMAL LOW (ref 17–63)
ANION GAP: 6 (ref 5–15)
AST: 22 U/L (ref 15–41)
Albumin: 4.1 g/dL (ref 3.5–5.0)
BILIRUBIN TOTAL: 0.5 mg/dL (ref 0.3–1.2)
BUN: 32 mg/dL — ABNORMAL HIGH (ref 6–20)
CALCIUM: 9 mg/dL (ref 8.9–10.3)
CO2: 30 mmol/L (ref 22–32)
Chloride: 102 mmol/L (ref 101–111)
Creatinine, Ser: 1.22 mg/dL (ref 0.61–1.24)
GFR calc non Af Amer: >60 mL/min (ref >60)
Glucose, Bld: 93 mg/dL (ref 65–99)
Potassium: 3.8 mmol/L (ref 3.5–5.1)
Sodium: 138 mmol/L (ref 135–145)
TOTAL PROTEIN: 6.5 g/dL (ref 6.5–8.1)

## 2016-07-14 LAB — TSH: TSH: 5.971 u[IU]/mL — AB (ref 0.350–4.500)
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
## 2016-07-15 LAB — T4: T4 TOTAL: 5.4 ug/dL (ref 4.5–12.0)

## 2016-09-04 ENCOUNTER — Other Ambulatory Visit: Payer: Self-pay | Admitting: *Deleted

## 2016-09-04 DIAGNOSIS — C01 Malignant neoplasm of base of tongue: Secondary | ICD-10-CM

## 2016-09-05 ENCOUNTER — Telehealth (INDEPENDENT_AMBULATORY_CARE_PROVIDER_SITE_OTHER): Payer: Self-pay

## 2016-09-05 NOTE — Telephone Encounter (Signed)
Attempted to contact patient to schedule his procedure to have his port removed and had to leave a message for him to return my call.

## 2017-01-12 ENCOUNTER — Ambulatory Visit: Payer: Medicare Other | Admitting: Oncology

## 2017-01-12 ENCOUNTER — Other Ambulatory Visit: Payer: Medicare Other

## 2017-01-19 ENCOUNTER — Inpatient Hospital Stay: Payer: Medicare Other | Admitting: Oncology

## 2017-01-19 ENCOUNTER — Inpatient Hospital Stay: Payer: Medicare Other

## 2018-01-19 ENCOUNTER — Other Ambulatory Visit: Payer: Self-pay | Admitting: *Deleted

## 2018-01-19 DIAGNOSIS — Z95828 Presence of other vascular implants and grafts: Secondary | ICD-10-CM

## 2018-01-21 ENCOUNTER — Encounter (INDEPENDENT_AMBULATORY_CARE_PROVIDER_SITE_OTHER): Payer: Self-pay

## 2018-02-01 ENCOUNTER — Other Ambulatory Visit (INDEPENDENT_AMBULATORY_CARE_PROVIDER_SITE_OTHER): Payer: Self-pay | Admitting: Nurse Practitioner

## 2018-02-01 MED ORDER — CLINDAMYCIN PHOSPHATE 300 MG/50ML IV SOLN
300.0000 mg | Freq: Once | INTRAVENOUS | Status: AC
  Administered 2018-02-02: 300 mg via INTRAVENOUS

## 2018-02-02 ENCOUNTER — Encounter
Admission: RE | Disposition: A | Discharge status: Home / Self Care | Payer: Self-pay | Source: Ambulatory Visit | Attending: Vascular Surgery

## 2018-02-02 ENCOUNTER — Ambulatory Visit
Admission: RE | Admit: 2018-02-02 | Discharge: 2018-02-02 | Disposition: A | Discharge status: Home / Self Care | Payer: Medicare Other | Source: Ambulatory Visit | Attending: Vascular Surgery | Admitting: Vascular Surgery

## 2018-02-02 DIAGNOSIS — Z85810 Personal history of malignant neoplasm of tongue: Secondary | ICD-10-CM | POA: Insufficient documentation

## 2018-02-02 DIAGNOSIS — Z87891 Personal history of nicotine dependence: Secondary | ICD-10-CM | POA: Insufficient documentation

## 2018-02-02 DIAGNOSIS — C029 Malignant neoplasm of tongue, unspecified: Secondary | ICD-10-CM

## 2018-02-02 DIAGNOSIS — F329 Major depressive disorder, single episode, unspecified: Secondary | ICD-10-CM | POA: Insufficient documentation

## 2018-02-02 DIAGNOSIS — I10 Essential (primary) hypertension: Secondary | ICD-10-CM | POA: Insufficient documentation

## 2018-02-02 DIAGNOSIS — K219 Gastro-esophageal reflux disease without esophagitis: Secondary | ICD-10-CM | POA: Diagnosis not present

## 2018-02-02 DIAGNOSIS — E785 Hyperlipidemia, unspecified: Secondary | ICD-10-CM | POA: Insufficient documentation

## 2018-02-02 DIAGNOSIS — N289 Disorder of kidney and ureter, unspecified: Secondary | ICD-10-CM | POA: Insufficient documentation

## 2018-02-02 DIAGNOSIS — Z452 Encounter for adjustment and management of vascular access device: Secondary | ICD-10-CM | POA: Insufficient documentation

## 2018-02-02 DIAGNOSIS — Z85038 Personal history of other malignant neoplasm of large intestine: Secondary | ICD-10-CM | POA: Diagnosis not present

## 2018-02-02 DIAGNOSIS — C9101 Acute lymphoblastic leukemia, in remission: Secondary | ICD-10-CM | POA: Insufficient documentation

## 2018-02-02 DIAGNOSIS — Z880 Allergy status to penicillin: Secondary | ICD-10-CM | POA: Insufficient documentation

## 2018-02-02 DIAGNOSIS — Z9221 Personal history of antineoplastic chemotherapy: Secondary | ICD-10-CM | POA: Diagnosis not present

## 2018-02-02 HISTORY — PX: PORTA CATH REMOVAL: CATH118286

## 2018-02-02 SURGERY — PORTA CATH REMOVAL
Anesthesia: Moderate Sedation

## 2018-02-02 MED ORDER — MIDAZOLAM HCL 2 MG/2ML IJ SOLN
INTRAMUSCULAR | Status: DC | PRN
  Administered 2018-02-02: 2 mg via INTRAVENOUS

## 2018-02-02 MED ORDER — HEPARIN (PORCINE) IN NACL 1000-0.9 UT/500ML-% IV SOLN
INTRAVENOUS | Status: AC
  Filled 2018-02-02: qty 500

## 2018-02-02 MED ORDER — FAMOTIDINE 20 MG PO TABS: 40.0000 mg | ORAL_TABLET | Freq: Once | ORAL | Status: DC

## 2018-02-02 MED ORDER — CLINDAMYCIN PHOSPHATE 300 MG/50ML IV SOLN
INTRAVENOUS | Status: AC
  Administered 2018-02-02: 300 mg via INTRAVENOUS
  Filled 2018-02-02: qty 50

## 2018-02-02 MED ORDER — HYDROMORPHONE HCL 1 MG/ML IJ SOLN: 1.0000 mg | Freq: Once | INTRAMUSCULAR | Status: DC

## 2018-02-02 MED ORDER — METHYLPREDNISOLONE SODIUM SUCC 125 MG IJ SOLR: 125.0000 mg | Freq: Once | INTRAMUSCULAR | Status: DC

## 2018-02-02 MED ORDER — ONDANSETRON HCL 4 MG/2ML IJ SOLN: 4.0000 mg | Freq: Four times a day (QID) | INTRAMUSCULAR | Status: DC | PRN

## 2018-02-02 MED ORDER — MIDAZOLAM HCL 5 MG/5ML IJ SOLN
INTRAMUSCULAR | Status: AC
  Filled 2018-02-02: qty 5

## 2018-02-02 MED ORDER — FENTANYL CITRATE (PF) 100 MCG/2ML IJ SOLN
INTRAMUSCULAR | Status: AC
  Filled 2018-02-02: qty 2

## 2018-02-02 MED ORDER — FENTANYL CITRATE (PF) 100 MCG/2ML IJ SOLN
INTRAMUSCULAR | Status: DC | PRN
  Administered 2018-02-02: 50 ug via INTRAVENOUS

## 2018-02-02 MED ORDER — LIDOCAINE-EPINEPHRINE (PF) 1 %-1:200000 IJ SOLN
INTRAMUSCULAR | Status: AC
  Filled 2018-02-02: qty 30

## 2018-02-02 MED ORDER — SODIUM CHLORIDE 0.9 % IV SOLN
INTRAVENOUS | Status: DC
  Administered 2018-02-02: 1000 mL via INTRAVENOUS

## 2018-02-02 SURGICAL SUPPLY — 8 items
ADH SKN CLS APL DERMABOND .7 (GAUZE/BANDAGES/DRESSINGS) ×1
DERMABOND ADVANCED (GAUZE/BANDAGES/DRESSINGS) ×2
DERMABOND ADVANCED .7 DNX12 (GAUZE/BANDAGES/DRESSINGS) IMPLANT
DRAPE INCISE IOBAN 66X45 STRL (DRAPES) ×2 IMPLANT
PACK ANGIOGRAPHY (CUSTOM PROCEDURE TRAY) ×2 IMPLANT
SUT MNCRL AB 4-0 PS2 18 (SUTURE) ×2 IMPLANT
SUT VICRYL+ 3-0 36IN CT-1 (SUTURE) ×2 IMPLANT
TOWEL OR 17X26 4PK STRL BLUE (TOWEL DISPOSABLE) ×2 IMPLANT
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
## 2018-02-02 NOTE — Progress Notes (Signed)
Talked with patient's brother via telephone, he is at work and unable to provide transportation until 12:30pm. He states that there is nobody else to transport patient. Told patient's brother that we would keep patient here until he is able to come to pickup.

## 2018-02-02 NOTE — Op Note (Signed)
  OPERATIVE NOTE   PROCEDURE: Removal of Infuse-a-Port  PRE-OPERATIVE DIAGNOSIS: Squamous cell carcinoma of the tongue  POST-OPERATIVE DIAGNOSIS: Same  SURGEON: Hortencia Pilar, M.D.  ANESTHESIA: Conscious sedation was administered under my direct supervision by the interventional radiology RN. IV Versed plus fentanyl were utilized. Continuous ECG, pulse oximetry and blood pressure was monitored throughout the entire procedure.  Conscious sedation was for a total of 23 minutes.   ESTIMATED BLOOD LOSS: Minimal   SPECIMEN(S):  Infuse-a-port intact  INDICATIONS:   Mathew Robinson is a 62 y.o. y.o. male who presents with stage IV squamous cell carcinoma of the tongue that is in remission.  The chemotherapy has been completed and the port is no longer required. Patient is therefore undergoing removal of the port. The risks and benefits of been reviewed all questions answered patient agrees to proceed with port removal   DESCRIPTION: After obtaining full informed written consent, the patient is brought to special procedures and positioned supine.  The patient received IV antibiotics.  The patient was prepped and draped in the standard fashion appropriate time out is called.    After infiltrating 1% lidocaine with epinephrine into the soft tissues and skin surrounding the port the previous incisional scar is reopened with an 11 blade scalpel.  The port is slipped from the pocket and subsequently removed without difficulty otherwise intact.  Pressure is held at the base of the neck for 5 minutes, a pocket is irrigated. Subtotally the wound is packed with saline moistened gauze and sterile dressings applied.  The patient tolerated the procedure without changes  COMPLICATIONS: None  CONDITION: Good  Hortencia Pilar, M.D. [facility] Office: [phone]   02/02/2018, 8:34 AM

## 2018-02-02 NOTE — Progress Notes (Signed)
Margaretha Seeds, patient's brother, at request of patient to find out when he would come pick up patient. Clare Gandy stated that they have been "real busy at work and may be able to leave in the next 20 minutes to come" pick up patient.

## 2018-02-16 ENCOUNTER — Ambulatory Visit (INDEPENDENT_AMBULATORY_CARE_PROVIDER_SITE_OTHER): Payer: Medicare Other | Admitting: Vascular Surgery

## 2022-01-29 ENCOUNTER — Encounter (HOSPITAL_COMMUNITY): Payer: Self-pay

## 2022-01-29 ENCOUNTER — Emergency Department (HOSPITAL_COMMUNITY): Payer: Medicare Other

## 2022-01-29 ENCOUNTER — Other Ambulatory Visit: Payer: Self-pay

## 2022-01-29 ENCOUNTER — Observation Stay (HOSPITAL_COMMUNITY): Payer: Medicare Other

## 2022-01-29 ENCOUNTER — Inpatient Hospital Stay (HOSPITAL_COMMUNITY)
Admission: EM | Admit: 2022-01-29 | Discharge: 2022-01-31 | DRG: 684 | Disposition: A | Discharge status: Home / Self Care | Payer: Medicare Other | Attending: Family Medicine | Admitting: Family Medicine

## 2022-01-29 DIAGNOSIS — Z83438 Family history of other disorder of lipoprotein metabolism and other lipidemia: Secondary | ICD-10-CM

## 2022-01-29 DIAGNOSIS — E860 Dehydration: Secondary | ICD-10-CM | POA: Diagnosis present

## 2022-01-29 DIAGNOSIS — R339 Retention of urine, unspecified: Secondary | ICD-10-CM | POA: Diagnosis not present

## 2022-01-29 DIAGNOSIS — I10 Essential (primary) hypertension: Secondary | ICD-10-CM | POA: Diagnosis present

## 2022-01-29 DIAGNOSIS — Z8249 Family history of ischemic heart disease and other diseases of the circulatory system: Secondary | ICD-10-CM

## 2022-01-29 DIAGNOSIS — G8929 Other chronic pain: Secondary | ICD-10-CM | POA: Diagnosis present

## 2022-01-29 DIAGNOSIS — D72829 Elevated white blood cell count, unspecified: Secondary | ICD-10-CM | POA: Diagnosis present

## 2022-01-29 DIAGNOSIS — E785 Hyperlipidemia, unspecified: Secondary | ICD-10-CM | POA: Diagnosis present

## 2022-01-29 DIAGNOSIS — Z87891 Personal history of nicotine dependence: Secondary | ICD-10-CM

## 2022-01-29 DIAGNOSIS — Z79899 Other long term (current) drug therapy: Secondary | ICD-10-CM

## 2022-01-29 DIAGNOSIS — M549 Dorsalgia, unspecified: Secondary | ICD-10-CM | POA: Diagnosis present

## 2022-01-29 DIAGNOSIS — R338 Other retention of urine: Secondary | ICD-10-CM | POA: Diagnosis present

## 2022-01-29 DIAGNOSIS — Z85038 Personal history of other malignant neoplasm of large intestine: Secondary | ICD-10-CM

## 2022-01-29 DIAGNOSIS — Z888 Allergy status to other drugs, medicaments and biological substances status: Secondary | ICD-10-CM

## 2022-01-29 DIAGNOSIS — Z9221 Personal history of antineoplastic chemotherapy: Secondary | ICD-10-CM

## 2022-01-29 DIAGNOSIS — G894 Chronic pain syndrome: Secondary | ICD-10-CM | POA: Diagnosis present

## 2022-01-29 DIAGNOSIS — Z87820 Personal history of traumatic brain injury: Secondary | ICD-10-CM

## 2022-01-29 DIAGNOSIS — F1210 Cannabis abuse, uncomplicated: Secondary | ICD-10-CM | POA: Diagnosis present

## 2022-01-29 DIAGNOSIS — E039 Hypothyroidism, unspecified: Secondary | ICD-10-CM | POA: Diagnosis present

## 2022-01-29 DIAGNOSIS — Z85810 Personal history of malignant neoplasm of tongue: Secondary | ICD-10-CM

## 2022-01-29 DIAGNOSIS — Z808 Family history of malignant neoplasm of other organs or systems: Secondary | ICD-10-CM

## 2022-01-29 DIAGNOSIS — Z789 Other specified health status: Secondary | ICD-10-CM | POA: Diagnosis present

## 2022-01-29 DIAGNOSIS — K219 Gastro-esophageal reflux disease without esophagitis: Secondary | ICD-10-CM | POA: Diagnosis present

## 2022-01-29 DIAGNOSIS — I959 Hypotension, unspecified: Secondary | ICD-10-CM | POA: Diagnosis present

## 2022-01-29 DIAGNOSIS — N179 Acute kidney failure, unspecified: Secondary | ICD-10-CM | POA: Diagnosis not present

## 2022-01-29 DIAGNOSIS — Z880 Allergy status to penicillin: Secondary | ICD-10-CM

## 2022-01-29 DIAGNOSIS — Z923 Personal history of irradiation: Secondary | ICD-10-CM

## 2022-01-29 DIAGNOSIS — Z9049 Acquired absence of other specified parts of digestive tract: Secondary | ICD-10-CM

## 2022-01-29 LAB — URINALYSIS, ROUTINE W REFLEX MICROSCOPIC
Bilirubin Urine: NEGATIVE
Glucose, UA: NEGATIVE mg/dL
Hgb urine dipstick: NEGATIVE
Ketones, ur: NEGATIVE mg/dL
Leukocytes,Ua: NEGATIVE
Nitrite: NEGATIVE
Protein, ur: NEGATIVE mg/dL
Specific Gravity, Urine: 1.012 (ref 1.005–1.030)
pH: 5 (ref 5.0–8.0)

## 2022-01-29 LAB — CBC
HCT: 40.6 % (ref 39.0–52.0)
Hemoglobin: 13.4 g/dL (ref 13.0–17.0)
MCH: 31.8 pg (ref 26.0–34.0)
MCHC: 33 g/dL (ref 30.0–36.0)
MCV: 96.2 fL (ref 80.0–100.0)
Platelets: 240 10*3/uL (ref 150–400)
RBC: 4.22 MIL/uL (ref 4.22–5.81)
RDW: 14.2 % (ref 11.5–15.5)
WBC: 13.8 10*3/uL — ABNORMAL HIGH (ref 4.0–10.5)
nRBC: 0 % (ref 0.0–0.2)

## 2022-01-29 LAB — BASIC METABOLIC PANEL
Anion gap: 13 (ref 5–15)
BUN: 108 mg/dL — ABNORMAL HIGH (ref 8–23)
CO2: 21 mmol/L — ABNORMAL LOW (ref 22–32)
Calcium: 9.2 mg/dL (ref 8.9–10.3)
Chloride: 100 mmol/L (ref 98–111)
Creatinine, Ser: 4.22 mg/dL — ABNORMAL HIGH (ref 0.61–1.24)
GFR, Estimated: 15 mL/min — ABNORMAL LOW (ref >60)
Glucose, Bld: 104 mg/dL — ABNORMAL HIGH (ref 70–99)
Potassium: 4.1 mmol/L (ref 3.5–5.1)
Sodium: 134 mmol/L — ABNORMAL LOW (ref 135–145)

## 2022-01-29 MED ORDER — ACETAMINOPHEN 325 MG PO TABS
650.0000 mg | ORAL_TABLET | Freq: Four times a day (QID) | ORAL | Status: DC | PRN
  Administered 2022-01-30: 650 mg via ORAL
  Filled 2022-01-29: qty 2

## 2022-01-29 MED ORDER — THIAMINE HCL 100 MG/ML IJ SOLN
100.0000 mg | Freq: Every day | INTRAMUSCULAR | Status: DC
  Filled 2022-01-29: qty 2

## 2022-01-29 MED ORDER — LEVOTHYROXINE SODIUM 100 MCG PO TABS
100.0000 ug | ORAL_TABLET | Freq: Every day | ORAL | Status: DC
  Administered 2022-01-30 – 2022-01-31 (×2): 100 ug via ORAL
  Filled 2022-01-29 (×2): qty 1

## 2022-01-29 MED ORDER — LACTATED RINGERS IV SOLN: INTRAVENOUS | Status: AC

## 2022-01-29 MED ORDER — THIAMINE HCL 100 MG PO TABS
100.0000 mg | ORAL_TABLET | Freq: Every day | ORAL | Status: DC
Start: 2022-01-29 — End: 2022-01-31
  Administered 2022-01-29 – 2022-01-31 (×3): 100 mg via ORAL
  Filled 2022-01-29 (×3): qty 1

## 2022-01-29 MED ORDER — FOLIC ACID 1 MG PO TABS
1.0000 mg | ORAL_TABLET | Freq: Every day | ORAL | Status: DC
  Administered 2022-01-29 – 2022-01-31 (×3): 1 mg via ORAL
  Filled 2022-01-29 (×3): qty 1

## 2022-01-29 MED ORDER — ONDANSETRON HCL 4 MG PO TABS: 4.0000 mg | ORAL_TABLET | Freq: Four times a day (QID) | ORAL | Status: DC | PRN

## 2022-01-29 MED ORDER — OXYCODONE-ACETAMINOPHEN 5-325 MG PO TABS: 1.0000 | ORAL_TABLET | ORAL | Status: DC | PRN

## 2022-01-29 MED ORDER — LORAZEPAM 2 MG/ML IJ SOLN: 1.0000 mg | INTRAMUSCULAR | Status: DC | PRN

## 2022-01-29 MED ORDER — ATORVASTATIN CALCIUM 40 MG PO TABS
40.0000 mg | ORAL_TABLET | Freq: Every day | ORAL | Status: DC
  Administered 2022-01-30 – 2022-01-31 (×2): 40 mg via ORAL
  Filled 2022-01-29 (×2): qty 1

## 2022-01-29 MED ORDER — OXYCODONE-ACETAMINOPHEN 10-325 MG PO TABS: 1.0000 | ORAL_TABLET | ORAL | Status: DC | PRN

## 2022-01-29 MED ORDER — OXYCODONE HCL 5 MG PO TABS: 5.0000 mg | ORAL_TABLET | ORAL | Status: DC | PRN

## 2022-01-29 MED ORDER — LACTATED RINGERS IV BOLUS
1000.0000 mL | Freq: Once | INTRAVENOUS | Status: AC
  Administered 2022-01-29: 1000 mL via INTRAVENOUS

## 2022-01-29 MED ORDER — LORAZEPAM 1 MG PO TABS: 1.0000 mg | ORAL_TABLET | ORAL | Status: DC | PRN

## 2022-01-29 MED ORDER — HEPARIN SODIUM (PORCINE) 5000 UNIT/ML IJ SOLN
5000.0000 [IU] | Freq: Three times a day (TID) | INTRAMUSCULAR | Status: DC
  Administered 2022-01-29 – 2022-01-31 (×6): 5000 [IU] via SUBCUTANEOUS
  Filled 2022-01-29 (×6): qty 1

## 2022-01-29 MED ORDER — ADULT MULTIVITAMIN W/MINERALS CH
1.0000 | ORAL_TABLET | Freq: Every day | ORAL | Status: DC
  Administered 2022-01-29 – 2022-01-31 (×3): 1 via ORAL
  Filled 2022-01-29 (×3): qty 1

## 2022-01-29 MED ORDER — SENNOSIDES-DOCUSATE SODIUM 8.6-50 MG PO TABS: 1.0000 | ORAL_TABLET | Freq: Every evening | ORAL | Status: DC | PRN

## 2022-01-29 MED ORDER — METOPROLOL SUCCINATE ER 25 MG PO TB24
25.0000 mg | ORAL_TABLET | Freq: Every day | ORAL | Status: DC
  Administered 2022-01-30 – 2022-01-31 (×2): 25 mg via ORAL
  Filled 2022-01-29 (×2): qty 1

## 2022-01-29 MED ORDER — ONDANSETRON HCL 4 MG/2ML IJ SOLN: 4.0000 mg | Freq: Four times a day (QID) | INTRAMUSCULAR | Status: DC | PRN

## 2022-01-29 MED ORDER — ACETAMINOPHEN 650 MG RE SUPP: 650.0000 mg | Freq: Four times a day (QID) | RECTAL | Status: DC | PRN

## 2022-01-29 MED ORDER — OXYCODONE-ACETAMINOPHEN 5-325 MG PO TABS
1.0000 | ORAL_TABLET | Freq: Once | ORAL | Status: AC
  Administered 2022-01-29: 1 via ORAL
  Filled 2022-01-29: qty 1

## 2022-01-29 MED ORDER — MORPHINE SULFATE ER 15 MG PO TBCR
30.0000 mg | EXTENDED_RELEASE_TABLET | Freq: Every day | ORAL | Status: DC
  Administered 2022-01-29 – 2022-01-30 (×2): 30 mg via ORAL
  Filled 2022-01-29 (×2): qty 2

## 2022-01-29 MED ORDER — QUETIAPINE FUMARATE 100 MG PO TABS
100.0000 mg | ORAL_TABLET | Freq: Every day | ORAL | Status: DC
  Administered 2022-01-29 – 2022-01-30 (×2): 100 mg via ORAL
  Filled 2022-01-29 (×2): qty 1

## 2022-01-29 NOTE — Assessment & Plan Note (Addendum)
Hold HCTZ and lisinopril.  Continue Toprol-XL.

## 2022-01-29 NOTE — Assessment & Plan Note (Addendum)
Chronic back pain on chronic opioids.  Likely contributing to acute urinary retention. -Continue home morphine 30 mg qhs -Continue home Percocet 10-325 mg q4h prn -Follow-up with PCP

## 2022-01-29 NOTE — Assessment & Plan Note (Signed)
Reports drinking at least 1-2 beers daily with last drink 8/14.  Somewhat jittery, he is not sure if he feels like he is withdrawing. -Placed on CIWA protocol with Ativan prn

## 2022-01-29 NOTE — ED Notes (Signed)
Pt given sandwich and a drink.
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
## 2022-01-29 NOTE — Assessment & Plan Note (Addendum)
Creatinine 4.22 on admission, no recent labs for baseline but was 1.22 in January 2018.  Found to have urinary retention.  Suspect secondary to medication effect from chronic opioids and muscle relaxer. -S/p Foley placement 8/16 -Obtain renal ultrasound -Monitor strict UOP -Continue maintenance IV fluid overnight -Hold HCTZ, lisinopril -Repeat labs in a.m.

## 2022-01-29 NOTE — ED Notes (Signed)
Pt unable to urinate. Bladder scan showed 783 mL. MD made aware and verbal order for foley cath obtained.

## 2022-01-29 NOTE — Assessment & Plan Note (Signed)
Continue Synthroid °
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
## 2022-01-29 NOTE — Assessment & Plan Note (Signed)
WBC 13.8 without obvious signs/symptoms of infection.  Continue to monitor.

## 2022-01-29 NOTE — Assessment & Plan Note (Signed)
Continue atorvastatin
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
## 2022-01-30 DIAGNOSIS — I10 Essential (primary) hypertension: Secondary | ICD-10-CM | POA: Diagnosis present

## 2022-01-30 DIAGNOSIS — N179 Acute kidney failure, unspecified: Secondary | ICD-10-CM | POA: Diagnosis present

## 2022-01-30 DIAGNOSIS — Z888 Allergy status to other drugs, medicaments and biological substances status: Secondary | ICD-10-CM | POA: Diagnosis not present

## 2022-01-30 DIAGNOSIS — Z923 Personal history of irradiation: Secondary | ICD-10-CM | POA: Diagnosis not present

## 2022-01-30 DIAGNOSIS — Z880 Allergy status to penicillin: Secondary | ICD-10-CM | POA: Diagnosis not present

## 2022-01-30 DIAGNOSIS — I959 Hypotension, unspecified: Secondary | ICD-10-CM | POA: Diagnosis present

## 2022-01-30 DIAGNOSIS — E039 Hypothyroidism, unspecified: Secondary | ICD-10-CM | POA: Diagnosis present

## 2022-01-30 DIAGNOSIS — K219 Gastro-esophageal reflux disease without esophagitis: Secondary | ICD-10-CM | POA: Diagnosis present

## 2022-01-30 DIAGNOSIS — Z87820 Personal history of traumatic brain injury: Secondary | ICD-10-CM | POA: Diagnosis not present

## 2022-01-30 DIAGNOSIS — D72829 Elevated white blood cell count, unspecified: Secondary | ICD-10-CM | POA: Diagnosis present

## 2022-01-30 DIAGNOSIS — Z79899 Other long term (current) drug therapy: Secondary | ICD-10-CM | POA: Diagnosis not present

## 2022-01-30 DIAGNOSIS — E785 Hyperlipidemia, unspecified: Secondary | ICD-10-CM | POA: Diagnosis present

## 2022-01-30 DIAGNOSIS — Z87891 Personal history of nicotine dependence: Secondary | ICD-10-CM | POA: Diagnosis not present

## 2022-01-30 DIAGNOSIS — Z85810 Personal history of malignant neoplasm of tongue: Secondary | ICD-10-CM | POA: Diagnosis not present

## 2022-01-30 DIAGNOSIS — M549 Dorsalgia, unspecified: Secondary | ICD-10-CM | POA: Diagnosis present

## 2022-01-30 DIAGNOSIS — Z83438 Family history of other disorder of lipoprotein metabolism and other lipidemia: Secondary | ICD-10-CM | POA: Diagnosis not present

## 2022-01-30 DIAGNOSIS — F1210 Cannabis abuse, uncomplicated: Secondary | ICD-10-CM | POA: Diagnosis present

## 2022-01-30 DIAGNOSIS — G894 Chronic pain syndrome: Secondary | ICD-10-CM | POA: Diagnosis present

## 2022-01-30 DIAGNOSIS — R339 Retention of urine, unspecified: Secondary | ICD-10-CM | POA: Diagnosis present

## 2022-01-30 DIAGNOSIS — Z9049 Acquired absence of other specified parts of digestive tract: Secondary | ICD-10-CM | POA: Diagnosis not present

## 2022-01-30 DIAGNOSIS — Z808 Family history of malignant neoplasm of other organs or systems: Secondary | ICD-10-CM | POA: Diagnosis not present

## 2022-01-30 DIAGNOSIS — Z85038 Personal history of other malignant neoplasm of large intestine: Secondary | ICD-10-CM | POA: Diagnosis not present

## 2022-01-30 DIAGNOSIS — Z8249 Family history of ischemic heart disease and other diseases of the circulatory system: Secondary | ICD-10-CM | POA: Diagnosis not present

## 2022-01-30 DIAGNOSIS — Z9221 Personal history of antineoplastic chemotherapy: Secondary | ICD-10-CM | POA: Diagnosis not present

## 2022-01-30 LAB — BASIC METABOLIC PANEL
Anion gap: 7 (ref 5–15)
BUN: 85 mg/dL — ABNORMAL HIGH (ref 8–23)
CO2: 27 mmol/L (ref 22–32)
Calcium: 9.9 mg/dL (ref 8.9–10.3)
Chloride: 106 mmol/L (ref 98–111)
Creatinine, Ser: 2.17 mg/dL — ABNORMAL HIGH (ref 0.61–1.24)
GFR, Estimated: 33 mL/min — ABNORMAL LOW (ref >60)
Glucose, Bld: 101 mg/dL — ABNORMAL HIGH (ref 70–99)
Potassium: 3.7 mmol/L (ref 3.5–5.1)
Sodium: 140 mmol/L (ref 135–145)

## 2022-01-30 LAB — CBC
HCT: 38.7 % — ABNORMAL LOW (ref 39.0–52.0)
Hemoglobin: 13.2 g/dL (ref 13.0–17.0)
MCH: 32.4 pg (ref 26.0–34.0)
MCHC: 34.1 g/dL (ref 30.0–36.0)
MCV: 95.1 fL (ref 80.0–100.0)
Platelets: 244 10*3/uL (ref 150–400)
RBC: 4.07 MIL/uL — ABNORMAL LOW (ref 4.22–5.81)
RDW: 13.8 % (ref 11.5–15.5)
WBC: 10 10*3/uL (ref 4.0–10.5)
nRBC: 0 % (ref 0.0–0.2)

## 2022-01-30 LAB — HIV ANTIBODY (ROUTINE TESTING W REFLEX): HIV Screen 4th Generation wRfx: NONREACTIVE

## 2022-01-30 MED ORDER — SODIUM CHLORIDE 0.9 % IV SOLN: INTRAVENOUS | Status: DC

## 2022-01-30 NOTE — TOC Initial Note (Signed)
Transition of Care [facility]) - Initial/Assessment Note    Patient Details  Name: Mathew Robinson MRN: [id number] Date of Birth: 1956/02/10  Transition of [facility]) CM/SW Contact:    Illene Regulus, LCSW Phone Number: 01/30/2022, 10:03 AM  Clinical Narrative:                 CSW spoke with pt in an attempted to provided substance abuse resource , pt declined. TOC will sign off at this time. if new patient transition needs arise, please place a TOC consult.  Expected Discharge Plan: Home/Self Care Barriers to Discharge: Continued Medical Work up   Patient Goals and CMS Choice Patient states their goals for this hospitalization and ongoing recovery are:: return home      Expected Discharge Plan and Services Expected Discharge Plan: Home/Self Care                                              Prior Living Arrangements/Services     Patient language and need for interpreter reviewed:: Yes Do you feel safe going back to the place where you live?: Yes      Need for Family Participation in Patient Care: No (Comment) Care giver support system in place?: Yes (comment)   Criminal Activity/Legal Involvement Pertinent to Current Situation/Hospitalization: No - Comment as needed  Activities of Daily Living Home Assistive Devices/Equipment: Cane (specify quad or straight), Hearing aid (bilateral hearing aides) ADL Screening (condition at time of admission) Patient's cognitive ability adequate to safely complete daily activities?: Yes Is the patient deaf or have difficulty hearing?: Yes (bilateral hearing aides) Does the patient have difficulty seeing, even when wearing glasses/contacts?: No Does the patient have difficulty concentrating, remembering, or making decisions?: No Patient able to express need for assistance with ADLs?: Yes Does the patient have difficulty dressing or bathing?: No Independently performs ADLs?: Yes (appropriate for developmental age) Does the  patient have difficulty walking or climbing stairs?: Yes Weakness of Legs: Both Weakness of Arms/Hands: Both  Permission Sought/Granted   Permission granted to share information with : Yes, Verbal Permission Granted              Emotional Assessment Appearance:: Appears stated age Attitude/Demeanor/Rapport: Gracious Affect (typically observed): Accepting Orientation: : Oriented to Self, Oriented to Place, Oriented to  Time, Oriented to Situation Alcohol / Substance Use: Alcohol Use Psych Involvement: No (comment)  Admission diagnosis:  Urinary retention [R33.9] Acute kidney injury ([place]) [N17.9] Acute renal failure, unspecified acute renal failure type [facility]) [N17.9] Patient Active Problem List   Diagnosis Date Noted   Acute kidney injury due to urinary retention ([place]) 01/29/2022   Acute urinary retention 01/29/2022   Chronic pain 01/29/2022   Leukocytosis 01/29/2022   Hypothyroidism 01/29/2022   Alcohol use 01/29/2022   Hypomagnesemia    Anxiety    HTN (hypertension)    Hyperlipidemia    Malignant neoplasm of base of tongue ([place]) 10/18/2014   PCP:  Emelia Loron, MD Pharmacy:   [facility], [address] [place] [address] Phone: [phone] Fax: [phone]     Social Determinants of Health (SDOH) Interventions    Readmission Risk Interventions     No data to display
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
## 2022-01-30 NOTE — Progress Notes (Signed)
Mobility Specialist - Progress Note   01/30/22 1440  Mobility  Activity Ambulated with assistance in hallway  Level of Assistance Contact guard assist, steadying assist  Assistive Device Front wheel walker  Distance Ambulated (ft) 300 ft  Activity Response Tolerated well  $Mobility charge 1 Mobility   Pt received in bed and agreed for mobilization. Pt had no c/o dizziness or pain. Unsteady during ambulation. Pt used rest room once returning to room and was unsteady during that as well. Pt left in chair with all needs met with chair alarm on.   Roderick Pee Mobility Specialist

## 2022-01-31 DIAGNOSIS — N179 Acute kidney failure, unspecified: Secondary | ICD-10-CM | POA: Diagnosis not present

## 2022-01-31 LAB — BASIC METABOLIC PANEL
Anion gap: 4 — ABNORMAL LOW (ref 5–15)
BUN: 47 mg/dL — ABNORMAL HIGH (ref 8–23)
CO2: 27 mmol/L (ref 22–32)
Calcium: 9.5 mg/dL (ref 8.9–10.3)
Chloride: 113 mmol/L — ABNORMAL HIGH (ref 98–111)
Creatinine, Ser: 1.3 mg/dL — ABNORMAL HIGH (ref 0.61–1.24)
GFR, Estimated: >60 mL/min (ref >60)
Glucose, Bld: 97 mg/dL (ref 70–99)
Potassium: 4 mmol/L (ref 3.5–5.1)
Sodium: 144 mmol/L (ref 135–145)

## 2022-01-31 MED ORDER — HYDROCHLOROTHIAZIDE 25 MG PO TABS: 25.0000 mg | ORAL_TABLET | Freq: Every day | ORAL | Status: AC

## 2022-01-31 MED ORDER — TIZANIDINE HCL 4 MG PO TABS: 2.0000 mg | ORAL_TABLET | Freq: Three times a day (TID) | ORAL | Status: DC | PRN

## 2022-01-31 MED ORDER — LISINOPRIL 40 MG PO TABS: 40.0000 mg | ORAL_TABLET | Freq: Every day | ORAL | Status: AC

## 2022-01-31 MED ORDER — AMLODIPINE BESYLATE 10 MG PO TABS
10.0000 mg | ORAL_TABLET | Freq: Every day | ORAL | Status: DC
  Administered 2022-01-31: 10 mg via ORAL
  Filled 2022-01-31: qty 1

## 2022-01-31 NOTE — Progress Notes (Signed)
Nutrition Brief Note  Patient identified on the Malnutrition Screening Tool (MST) Report with a score of 2.0  Wt Readings from Last 15 Encounters:  01/31/22 72.2 kg  02/02/18 75.9 kg  07/14/16 75.9 kg  02/11/16 73.6 kg  11/07/15 73.2 kg  07/30/15 71 kg  07/25/15 72 kg  04/30/15 67.3 kg  04/27/15 68.9 kg  03/13/15 65.2 kg  01/25/15 67.7 kg  01/18/15 66.2 kg  01/11/15 70.4 kg  01/04/15 74 kg  12/28/14 71.7 kg    Body mass index is 23.51 kg/m. Patient meets criteria for normal weight based on current BMI. Skin WDL.   Current diet order is Heart Healthy and he ate 100% of breakfast and 100% of dinner yesterday. Labs and medications reviewed.   Patient with hx of alcohol abuse. He was admitted d/t AKI. MD note on 8/17 indicates likely to d/c today (8/18) pending improvement of renal function.  No nutrition interventions warranted at this time. If nutrition issues arise, please consult RD.      Jarome Matin, MS, RD, LDN, [place] Registered Dietitian II Inpatient Clinical Nutrition RD pager # and on-call/weekend pager # available in [facility]
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
## 2022-01-31 NOTE — Progress Notes (Signed)
Discharge teaching complete. Meds, diet, activity reviewed and all questions answered. Copy of instructions given to patient and patient waiting on nephew to come pick him up.
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
## 2022-01-31 NOTE — Progress Notes (Signed)
OT Cancellation Note  Patient Details Name: Mathew Robinson MRN: [id number] DOB: 1955-12-10   Cancelled Treatment:    Reason Eval/Treat Not Completed: OT screened, no needs identified, will sign off. PT reports patient is ambulating and reports he is at his baseline.  Mathew Robinson 01/31/2022, 1:18 PM
